# Patient Record
Sex: Female | Born: 1990 | Race: White | Hispanic: No | Marital: Married | Smoking: Never smoker
Health system: Southern US, Community
[De-identification: ages and names within clinical notes are randomized; demographics above are authoritative.]

## PROBLEM LIST (undated history)

## (undated) ENCOUNTER — Inpatient Hospital Stay: Payer: Self-pay

## (undated) DIAGNOSIS — Z789 Other specified health status: Secondary | ICD-10-CM

---
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
## 2012-12-20 ENCOUNTER — Telehealth: Payer: Self-pay | Admitting: Nurse Practitioner

## 2012-12-20 NOTE — Telephone Encounter (Signed)
Patient called regarding a question about a recent bill, and she also wanted to know if her new insurance will cover it.

## 2013-02-27 ENCOUNTER — Encounter: Payer: Self-pay | Admitting: Nurse Practitioner

## 2013-02-28 ENCOUNTER — Ambulatory Visit: Payer: Self-pay | Admitting: Nurse Practitioner

## 2013-02-28 ENCOUNTER — Encounter: Payer: Self-pay | Admitting: Nurse Practitioner

## 2014-07-29 ENCOUNTER — Other Ambulatory Visit: Payer: Self-pay | Admitting: Family Medicine

## 2014-07-29 DIAGNOSIS — E041 Nontoxic single thyroid nodule: Secondary | ICD-10-CM

## 2014-07-31 ENCOUNTER — Ambulatory Visit
Admission: RE | Admit: 2014-07-31 | Discharge: 2014-07-31 | Disposition: A | Discharge status: Home / Self Care | Payer: 59 | Source: Ambulatory Visit | Attending: Family Medicine | Admitting: Family Medicine

## 2014-07-31 DIAGNOSIS — E041 Nontoxic single thyroid nodule: Secondary | ICD-10-CM

## 2014-08-05 ENCOUNTER — Other Ambulatory Visit: Payer: Self-pay | Admitting: Family Medicine

## 2014-08-05 DIAGNOSIS — E041 Nontoxic single thyroid nodule: Secondary | ICD-10-CM

## 2014-08-13 ENCOUNTER — Ambulatory Visit
Admission: RE | Admit: 2014-08-13 | Discharge: 2014-08-13 | Disposition: A | Discharge status: Home / Self Care | Payer: 59 | Source: Ambulatory Visit | Attending: Family Medicine | Admitting: Family Medicine

## 2014-08-13 ENCOUNTER — Other Ambulatory Visit: Payer: Self-pay | Admitting: Family Medicine

## 2014-08-13 DIAGNOSIS — E041 Nontoxic single thyroid nodule: Secondary | ICD-10-CM

## 2014-09-03 LAB — OB RESULTS CONSOLE GC/CHLAMYDIA
CHLAMYDIA, DNA PROBE: NEGATIVE
GC PROBE AMP, GENITAL: NEGATIVE

## 2014-09-18 LAB — OB RESULTS CONSOLE HIV ANTIBODY (ROUTINE TESTING): HIV: NONREACTIVE

## 2014-09-18 LAB — OB RESULTS CONSOLE HEPATITIS B SURFACE ANTIGEN: HEP B S AG: NEGATIVE

## 2014-09-18 LAB — OB RESULTS CONSOLE VARICELLA ZOSTER ANTIBODY, IGG: Varicella: IMMUNE

## 2014-09-18 LAB — OB RESULTS CONSOLE RPR: RPR: NONREACTIVE

## 2014-09-18 LAB — OB RESULTS CONSOLE RUBELLA ANTIBODY, IGM: Rubella: IMMUNE

## 2014-09-18 LAB — OB RESULTS CONSOLE ABO/RH: RH TYPE: NEGATIVE

## 2015-01-22 LAB — OB RESULTS CONSOLE HIV ANTIBODY (ROUTINE TESTING): HIV: NONREACTIVE

## 2015-03-20 ENCOUNTER — Observation Stay
Admission: EM | Admit: 2015-03-20 | Discharge: 2015-03-20 | Disposition: A | Discharge status: Home / Self Care | Payer: 59 | Attending: Obstetrics & Gynecology | Admitting: Obstetrics & Gynecology

## 2015-03-20 ENCOUNTER — Encounter: Payer: Self-pay | Admitting: *Deleted

## 2015-03-20 DIAGNOSIS — Z3A35 35 weeks gestation of pregnancy: Secondary | ICD-10-CM | POA: Insufficient documentation

## 2015-03-20 DIAGNOSIS — N898 Other specified noninflammatory disorders of vagina: Secondary | ICD-10-CM | POA: Insufficient documentation

## 2015-03-20 DIAGNOSIS — O26893 Other specified pregnancy related conditions, third trimester: Secondary | ICD-10-CM | POA: Diagnosis present

## 2015-03-20 HISTORY — DX: Other specified health status: Z78.9

## 2015-03-20 LAB — URINALYSIS COMPLETE WITH MICROSCOPIC (ARMC ONLY)
BILIRUBIN URINE: NEGATIVE
GLUCOSE, UA: NEGATIVE mg/dL
HGB URINE DIPSTICK: NEGATIVE
Ketones, ur: NEGATIVE mg/dL
Nitrite: NEGATIVE
Protein, ur: NEGATIVE mg/dL
SPECIFIC GRAVITY, URINE: 1.016 (ref 1.005–1.030)
pH: 6 (ref 5.0–8.0)

## 2015-03-20 LAB — CHLAMYDIA/NGC RT PCR (ARMC ONLY)
CHLAMYDIA TR: NOT DETECTED
N gonorrhoeae: NOT DETECTED

## 2015-03-20 LAB — OB RESULTS CONSOLE GBS: STREP GROUP B AG: NEGATIVE

## 2015-03-20 MED ORDER — BETAMETHASONE SOD PHOS & ACET 6 (3-3) MG/ML IJ SUSP
12.0000 mg | INTRAMUSCULAR | Status: DC
  Administered 2015-03-20: 12 mg via INTRAMUSCULAR
  Filled 2015-03-20: qty 2

## 2015-03-20 MED ORDER — TERBUTALINE SULFATE 1 MG/ML IJ SOLN
0.2500 mg | Freq: Once | INTRAMUSCULAR | Status: AC
  Administered 2015-03-20: 0.25 mg via SUBCUTANEOUS

## 2015-03-20 MED ORDER — ACETAMINOPHEN 325 MG PO TABS: 650.0000 mg | ORAL_TABLET | ORAL | Status: DC | PRN

## 2015-03-20 MED ORDER — TERBUTALINE SULFATE 1 MG/ML IJ SOLN
INTRAMUSCULAR | Status: AC
  Administered 2015-03-20: 0.25 mg via SUBCUTANEOUS
  Filled 2015-03-20: qty 1

## 2015-03-20 NOTE — OB Triage Note (Signed)
Seen by Dr. Elesa MassedWard. Cervix reevaluated, no change. Discharge home. Follow-up next week with scheduled appointment. Elaina HoopsElks, Janelly Switalski S

## 2015-03-20 NOTE — Discharge Summary (Signed)
Hailey Jones is a 24 y.o. female. G1P0 @ 35w6 by 9w US with EDC of 04/18/15  Indication: thinks she broke her water  S: Resting comfortably. Not feeling any CTX, no VB. Active fetal movement.  O:  BP 117/58 mmHg  Pulse 78  Temp(Src) 98.1 F (36.7 C) (Oral)  Resp 20  Ht 5\' 3"  (1.6 m)  Wt 79.379 kg (175 lb)  BMI 31.01 kg/m2 Results for orders placed or performed during the hospital encounter of 03/20/15 (from the past 48 hour(s))  Urinalysis complete, with microscopic [facility] only)   Collection Time: 03/20/15  2:39 PM  Result Value Ref Range   Color, Urine YELLOW (A) YELLOW   APPearance CLEAR (A) CLEAR   Glucose, UA NEGATIVE NEGATIVE mg/dL   Bilirubin Urine NEGATIVE NEGATIVE   Ketones, ur NEGATIVE NEGATIVE mg/dL   Specific Gravity, Urine 1.016 1.005 - 1.030   Hgb urine dipstick NEGATIVE NEGATIVE   pH 6.0 5.0 - 8.0   Protein, ur NEGATIVE NEGATIVE mg/dL   Nitrite NEGATIVE NEGATIVE   Leukocytes, UA TRACE (A) NEGATIVE   RBC / HPF 0-5 0 - 5 RBC/hpf   WBC, UA 0-5 0 - 5 WBC/hpf   Bacteria, UA RARE (A) NONE SEEN   Squamous Epithelial / LPF 0-5 (A) NONE SEEN   Mucous PRESENT    Amorphous Crystal PRESENT      Gen: NAD, AAOx3      Abd: FNTTP      Ext: Non-tender, Nonedmeatous Pelvis: neg nitrazine, neg ferning, no pooling    FHT: 130 mod +accels no decels TOCO: q2-603min SVE: FT/thick/high, soft, middle --> see note below  I was scrubbed in the OR when patient presented.  Initial nursing exam was 5cm with bulging bag, with contractions q2-3 mins.  I verbally ordered terbutaline and betamethasone.  This was administered prior to my ability to evaluate the patient.  Upon evaluation, the patient was fingertip, and thick.  She has a smooth firm nodule on her anterior left cervix (1:00), consistent with, but not diagnostic of a nabothian cyst.  Cephalic by Leopolds.     A/P:  23yo G1P0 @ 35.6 with r/o ROM and r/o PTL   Labor: not present.   R/o ROM: SSE negative x 3. Neg nitrazine, neg  ferning.   Fetal Wellbeing: Reassuring Cat 1 tracing.  GBS, GC/CT collected and pending, urine negative (above).  Discussed with patient and husband my reasoning for the tocolytic and steroids.  Because 1/2 course BMZ was given, I have asked the patient to return tomorrow for the second shot for completion.  She states she will.  Patient never felt the contractions visible on monitoring.  D/c home stable, precautions reviewed, follow-up as scheduled. Preterm labor precautions reviewed.     ----- Ranae Plumberhelsea Terrianna Holsclaw, MD Attending Obstetrician and Gynecologist [facility]

## 2015-03-20 NOTE — OB Triage Note (Signed)
Possible SROM today @ approx. 1130. Reports clear fluid. No gush of fluid, more of a trickle. Elaina HoopsElks, Orianna Biskup S

## 2015-03-21 ENCOUNTER — Inpatient Hospital Stay
Admission: EM | Admit: 2015-03-21 | Discharge: 2015-03-21 | Disposition: A | Discharge status: Home / Self Care | Payer: 59 | Attending: Obstetrics & Gynecology | Admitting: Obstetrics & Gynecology

## 2015-03-21 DIAGNOSIS — Z3493 Encounter for supervision of normal pregnancy, unspecified, third trimester: Secondary | ICD-10-CM | POA: Diagnosis not present

## 2015-03-21 DIAGNOSIS — Z3A36 36 weeks gestation of pregnancy: Secondary | ICD-10-CM | POA: Insufficient documentation

## 2015-03-21 MED ORDER — BETAMETHASONE SOD PHOS & ACET 6 (3-3) MG/ML IJ SUSP
INTRAMUSCULAR | Status: AC
  Administered 2015-03-21: 12 mg via INTRAMUSCULAR
  Filled 2015-03-21: qty 1

## 2015-03-21 MED ORDER — BETAMETHASONE SOD PHOS & ACET 6 (3-3) MG/ML IJ SUSP
12.0000 mg | Freq: Once | INTRAMUSCULAR | Status: AC
  Administered 2015-03-21: 12 mg via INTRAMUSCULAR

## 2015-03-23 LAB — CULTURE, BETA STREP (GROUP B ONLY)

## 2015-04-19 ENCOUNTER — Observation Stay
Admission: EM | Admit: 2015-04-19 | Discharge: 2015-04-19 | Disposition: A | Discharge status: Home / Self Care | Payer: 59 | Source: Home / Self Care | Admitting: Obstetrics and Gynecology

## 2015-04-19 ENCOUNTER — Encounter: Payer: Self-pay | Admitting: *Deleted

## 2015-04-19 DIAGNOSIS — O163 Unspecified maternal hypertension, third trimester: Secondary | ICD-10-CM | POA: Diagnosis present

## 2015-04-19 LAB — COMPREHENSIVE METABOLIC PANEL
ALK PHOS: 144 U/L — AB (ref 38–126)
ALT: 10 U/L — AB (ref 14–54)
AST: 20 U/L (ref 15–41)
Albumin: 2.7 g/dL — ABNORMAL LOW (ref 3.5–5.0)
Anion gap: 4 — ABNORMAL LOW (ref 5–15)
BUN: 13 mg/dL (ref 6–20)
CHLORIDE: 109 mmol/L (ref 101–111)
CO2: 22 mmol/L (ref 22–32)
Calcium: 9 mg/dL (ref 8.9–10.3)
Creatinine, Ser: 0.7 mg/dL (ref 0.44–1.00)
Glucose, Bld: 93 mg/dL (ref 65–99)
Potassium: 3.7 mmol/L (ref 3.5–5.1)
Sodium: 135 mmol/L (ref 135–145)
Total Bilirubin: 0.4 mg/dL (ref 0.3–1.2)
Total Protein: 6.1 g/dL — ABNORMAL LOW (ref 6.5–8.1)

## 2015-04-19 LAB — TYPE AND SCREEN
ABO/RH(D): O NEG
Antibody Screen: NEGATIVE

## 2015-04-19 LAB — CBC
HEMATOCRIT: 30.1 % — AB (ref 35.0–47.0)
HEMOGLOBIN: 9.6 g/dL — AB (ref 12.0–16.0)
MCH: 25.8 pg — AB (ref 26.0–34.0)
MCHC: 31.8 g/dL — ABNORMAL LOW (ref 32.0–36.0)
MCV: 80.9 fL (ref 80.0–100.0)
Platelets: 226 10*3/uL (ref 150–440)
RBC: 3.73 MIL/uL — AB (ref 3.80–5.20)
RDW: 16.6 % — ABNORMAL HIGH (ref 11.5–14.5)
WBC: 8.7 10*3/uL (ref 3.6–11.0)

## 2015-04-19 LAB — PROTEIN / CREATININE RATIO, URINE
Creatinine, Urine: 210 mg/dL
Protein Creatinine Ratio: 0.26 mg/mg{Cre} — ABNORMAL HIGH (ref 0.00–0.15)
Total Protein, Urine: 54 mg/dL

## 2015-04-19 NOTE — OB Triage Note (Signed)
Sent from office for ellevated BPs 130/90 143/97 if office, reactive NST and AFI 9.3 cervix 0/40/-3.

## 2015-04-19 NOTE — H&P (Addendum)
Obstetrics Admission History & Physical  04/19/2015 - 5:31 PM Primary OBGYN: [facility]  Chief Complaint: elevated BPs in clinic  History of Present Illness  24 y.o. G1 @ [redacted]w[redacted]d (Dating: EDC 11/13, dated by 9wk u/s), with the above CC. Pregnancy complicated by: BMI 33, ?hypothyroidism, Rh negative, half-sister with dandy walker.  Ms. Quinita Kostelecky states that she has no decreased FM, labor s/s or s/s of pre-eclampsia (cp, sob, HA, visual s/s, belly pains). Patient was seen in clinic and 130/90, 143/97, protein trace with normal AFI and cephalic. Pt states BP was taken right after cx check and she states that she was "unfavorable."  Review of Systems:  her 12 point review of systems is negative or as noted in the History of Present Illness.  PMHx:  Past Medical History  Diagnosis Date  . Medical history non-contributory    PSHx:  Past Surgical History  Procedure Laterality Date  . Wisdom tooth extraction  2011   Medications:  Prescriptions prior to admission  Medication Sig Dispense Refill Last Dose  . Prenatal Vit-Fe Fumarate-FA (MULTIVITAMIN-PRENATAL) 27-0.8 MG TABS tablet Take 1 tablet by mouth daily at 12 noon.   03/19/2015 at Unknown time    Allergies: has No Known Allergies. OBHx:  OB History  Gravida Para Term Preterm AB SAB TAB Ectopic Multiple Living     # Outcome Date GA Lbr Len/2nd Weight Sex Delivery Anes PTL Lv  1 Current              GYNHx:  History of abnormal pap smears: No. History of STIs: No..             FHx:  Family History  Problem Relation Age of Onset  . Hypertension Father   . Hypertension Maternal Grandmother   . Heart disease Maternal Grandfather   . Hypertension Paternal Grandmother   . Heart disease Paternal Grandmother   . Heart disease Paternal Grandfather    Soc Hx:  Social History   Social History  . Marital Status: Married    Spouse Name: N/A  . Number of Children: 0  . Years of Education: N/A   Occupational  History  . Not on file.   Social History Main Topics  . Smoking status: Never Smoker   . Smokeless tobacco: Never Used  . Alcohol Use: No  . Drug Use: No  . Sexual Activity: Yes   Other Topics Concern  . Not on file   Social History Narrative    Objective    Current Vital Signs 24h Vital Sign Ranges  T   No Data Recorded  BP 131/80 mmHg BP  Min: 123/59  Max: 131/80  HR 75 Pulse  Avg: 76  Min: 75  Max: 77  RR  18 No Data Recorded  SaO2    98/RA No Data Recorded       24 Hour I/O Current Shift I/O  Time Ins Outs       Patient Vitals for the past 6 hrs:  BP Pulse  04/19/15 1700 131/80 mmHg 75  04/19/15 1613 (!) 123/59 mmHg 77   EFM: 125 baseline, +accels, no decels, mod var  Toco: irregular  General: Well nourished, well developed female in no acute distress.  Skin:  Warm and dry.  Cardiovascular: Regular rate and rhythm. Respiratory:  Clear to auscultation bilateral. Normal respiratory effort Abdomen: gravid, nttp Neuro/Psych:  Normal mood and affect. 1+ brachial  Labs  none  Radiology none  Perinatal info  O neg/ Rubella  Immune / Varicella Immune/RPR neg/HIV Negative/HepB Surf Ag Negative/TDaP: yes / Flu shot: needed/pap neg 2016/  Assessment & Plan   24 y.o. G1P0000 @ 3636w1d with possible gHTN vs pre-eclampsia at term; pt currently doing well *IUP: rNST. Okay to come off monitor *HTN work up: continue with serial BPs. Follow up lab work. If negative and BPs still normal, recommend 2-3d BP check and can set her up for PDIOL today. Precautions given *?hypothyroidism: pt was dx just prior to pregnancy dx and self-d/c'ed due to ?causing some heart palps, as suggested by cardiologist, and patient wasn't taking and had normal 28wk TFTs. No heart s/s since self-d/c *GBS: neg *Rh negative: PP PRN *Dispo: pending labs and serial BPs  Lockhart Bingharlie Wilberta Dorvil, Montez HagemanJr. MD [facility] Pager [phone]    Recent Labs Lab 04/19/15 1634  WBC 8.7  HGB 9.6*  HCT 30.1*   PLT 226    Recent Labs Lab 04/19/15 1634  NA 135  K 3.7  CL 109  CO2 22  BUN 13  CREATININE 0.70  CALCIUM 9.0  PROT 6.1*  BILITOT 0.4  ALKPHOS 144*  ALT 10*  AST 20  GLUCOSE 93   PC ratio 260 Repeat BP 131/81 after one prior was SBP 140 (pt laying on side with cuff below heart level) Pt still astymptomatic  Will see her back on Thursday for BP check Set up for PDIOL on 11/22 @ 2000. Unable to do sooner b/c of dates are full Will see her back in clinic for 41/0 or 41/1 NST, AFI b/c post dates Precautions given Time 1750 Cornelia Copaharlie Braven Wolk, Jr MD [facility]  Pager: [phone]

## 2015-04-19 NOTE — Discharge Instructions (Signed)
Preeclampsia and Eclampsia °Preeclampsia is a serious condition that develops only during pregnancy. It is also called toxemia of pregnancy. This condition causes high blood pressure along with other symptoms, such as swelling and headaches. These may develop as the condition gets worse. Preeclampsia may occur 20 weeks or later into your pregnancy.  °Diagnosing and treating preeclampsia early is very important. If not treated early, it can cause serious problems for you and your baby. One problem it can lead to is eclampsia, which is a condition that causes muscle jerking or shaking (convulsions) in the mother. Delivering your baby is the best treatment for preeclampsia or eclampsia.  °RISK FACTORS °The cause of preeclampsia is not known. You may be more likely to develop preeclampsia if you have certain risk factors. These include:  °· Being pregnant for the first time. °· Having preeclampsia in a past pregnancy. °· Having a family history of preeclampsia. °· Having high blood pressure. °· Being pregnant with twins or triplets. °· Being 35 or older. °· Being African American. °· Having kidney disease or diabetes. °· Having medical conditions such as lupus or blood diseases. °· Being very overweight (obese). °SIGNS AND SYMPTOMS  °The earliest signs of preeclampsia are: °· High blood pressure. °· Increased protein in your urine. Your health care provider will check for this at every prenatal visit. °Other symptoms that can develop include:  °· Severe headaches. °· Sudden weight gain. °· Swelling of your hands, face, legs, and feet. °· Feeling sick to your stomach (nauseous) and throwing up (vomiting). °· Vision problems (blurred or double vision). °· Numbness in your face, arms, legs, and feet. °· Dizziness. °· Slurred speech. °· Sensitivity to bright lights. °· Abdominal pain. °DIAGNOSIS  °There are no screening tests for preeclampsia. Your health care provider will ask you about symptoms and check for signs of  preeclampsia during your prenatal visits. You may also have tests, including: °· Urine testing. °· Blood testing. °· Checking your baby's heart rate. °· Checking the health of your baby and your placenta using images created with sound waves (ultrasound). °TREATMENT  °You can work out the best treatment approach together with your health care provider. It is very important to keep all prenatal appointments. If you have an increased risk of preeclampsia, you may need more frequent prenatal exams. °· Your health care provider may prescribe bed rest. °· You may have to eat as little salt as possible. °· You may need to take medicine to lower your blood pressure if the condition does not respond to more conservative measures. °· You may need to stay in the hospital if your condition is severe. There, treatment will focus on controlling your blood pressure and fluid retention. You may also need to take medicine to prevent seizures. °· If the condition gets worse, your baby may need to be delivered early to protect you and the baby. You may have your labor started with medicine (be induced), or you may have a cesarean delivery. °· Preeclampsia usually goes away after the baby is born. °HOME CARE INSTRUCTIONS  °· Only take over-the-counter or prescription medicines as directed by your health care provider. °· Lie on your left side while resting. This keeps pressure off your baby. °· Elevate your feet while resting. °· Get regular exercise. Ask your health care provider what type of exercise is safe for you. °· Avoid caffeine and alcohol. °· Do not smoke. °· Drink 6-8 glasses of water every day. °· Eat a balanced diet   that is low in salt. Do not add salt to your food.  Avoid stressful situations as much as possible.  Get plenty of rest and sleep.  Keep all prenatal appointments and tests as scheduled. SEEK MEDICAL CARE IF:  You are gaining more weight than expected.  You have any headaches, abdominal pain, or  nausea.  You are bruising more than usual.  You feel dizzy or light-headed. SEEK IMMEDIATE MEDICAL CARE IF:   You develop sudden or severe swelling anywhere in your body. This usually happens in the legs.  You gain 5 lb (2.3 kg) or more in a week.  You have a severe headache, dizziness, problems with your vision, or confusion.  You have severe abdominal pain.  You have lasting nausea or vomiting.  You have a seizure.  You have trouble moving any part of your body.  You develop numbness in your body.  You have trouble speaking.  You have any abnormal bleeding.  You develop a stiff neck.  You pass out. MAKE SURE YOU:   Understand these instructions.  Will watch your condition.  Will get help right away if you are not doing well or get worse.   This information is not intended to replace advice given to you by your health care provider. Make sure you discuss any questions you have with your health care provider.   Document Released: 05/19/2000 Document Revised: 05/27/2013 Document Reviewed: 03/14/2013 Elsevier Interactive Patient Education [employer]2016 Elsevier Inc.     We will discuss your surgery once again in detail at your post-op visit in two to four weeks. If you havent already done so, please call to make your appointment as soon as possible.  [facility]  [address]  [place], [place] [address] [place], [place] [address]  Phone: [phone] Phone: [phone]  Fax: [phone] Fax: [phone]

## 2015-04-19 NOTE — Discharge Summary (Signed)
Patient discharged home, discharge instructions given, patient states understanding. Patient left floor in stable condition, denies any other needs at this time. Patient to schedule appointment for wed or thurs

## 2015-04-19 NOTE — Discharge Summary (Signed)
OB Triage Discharge Summary   24 y.o. G1 @ 4117w1d presenting for pre-eclampsia work up.  Fetal status reassuring. EFM showed rNST with irregular UCs. Triage course: negative serial BPs, exam and lab work up for gHTN/pre-eclampsia  Labs pending: none RTC 2 days for BP check Disposition: Home. Pt set up for 11/22 @ 2000 PDIOL.    Cornelia Copaharlie Scout Guyett, Jr MD [facility]  Pager: [phone]

## 2015-04-20 LAB — ABO/RH: ABO/RH(D): O NEG

## 2015-04-21 ENCOUNTER — Inpatient Hospital Stay
Admission: EM | Admit: 2015-04-21 | Discharge: 2015-04-25 | DRG: 765 | Disposition: A | Discharge status: Home / Self Care | Payer: 59 | Attending: Obstetrics and Gynecology | Admitting: Obstetrics and Gynecology

## 2015-04-21 DIAGNOSIS — O480 Post-term pregnancy: Secondary | ICD-10-CM | POA: Diagnosis present

## 2015-04-21 DIAGNOSIS — E039 Hypothyroidism, unspecified: Secondary | ICD-10-CM | POA: Diagnosis present

## 2015-04-21 DIAGNOSIS — D62 Acute posthemorrhagic anemia: Secondary | ICD-10-CM | POA: Diagnosis not present

## 2015-04-21 DIAGNOSIS — O99284 Endocrine, nutritional and metabolic diseases complicating childbirth: Secondary | ICD-10-CM | POA: Diagnosis present

## 2015-04-21 DIAGNOSIS — Z3A40 40 weeks gestation of pregnancy: Secondary | ICD-10-CM | POA: Diagnosis not present

## 2015-04-21 DIAGNOSIS — O1494 Unspecified pre-eclampsia, complicating childbirth: Secondary | ICD-10-CM | POA: Diagnosis present

## 2015-04-21 DIAGNOSIS — O9081 Anemia of the puerperium: Secondary | ICD-10-CM | POA: Diagnosis not present

## 2015-04-21 DIAGNOSIS — O133 Gestational [pregnancy-induced] hypertension without significant proteinuria, third trimester: Secondary | ICD-10-CM | POA: Diagnosis present

## 2015-04-21 LAB — COMPREHENSIVE METABOLIC PANEL
ALBUMIN: 2.7 g/dL — AB (ref 3.5–5.0)
ALT: 10 U/L — ABNORMAL LOW (ref 14–54)
ANION GAP: 9 (ref 5–15)
AST: 17 U/L (ref 15–41)
Alkaline Phosphatase: 159 U/L — ABNORMAL HIGH (ref 38–126)
BUN: 13 mg/dL (ref 6–20)
CHLORIDE: 108 mmol/L (ref 101–111)
CO2: 21 mmol/L — ABNORMAL LOW (ref 22–32)
Calcium: 9.1 mg/dL (ref 8.9–10.3)
Creatinine, Ser: 0.58 mg/dL (ref 0.44–1.00)
GFR calc Af Amer: >60 mL/min (ref >60)
GFR calc non Af Amer: >60 mL/min (ref >60)
GLUCOSE: 87 mg/dL (ref 65–99)
POTASSIUM: 3.7 mmol/L (ref 3.5–5.1)
SODIUM: 138 mmol/L (ref 135–145)
TOTAL PROTEIN: 6.3 g/dL — AB (ref 6.5–8.1)
Total Bilirubin: 0.5 mg/dL (ref 0.3–1.2)

## 2015-04-21 LAB — TYPE AND SCREEN
ABO/RH(D): O NEG
Antibody Screen: NEGATIVE

## 2015-04-21 LAB — CHLAMYDIA/NGC RT PCR (ARMC ONLY)
Chlamydia Tr: NOT DETECTED
N GONORRHOEAE: NOT DETECTED

## 2015-04-21 LAB — PROTEIN / CREATININE RATIO, URINE
CREATININE, URINE: 229 mg/dL
PROTEIN CREATININE RATIO: 0.39 mg/mg{creat} — AB (ref 0.00–0.15)
TOTAL PROTEIN, URINE: 90 mg/dL

## 2015-04-21 LAB — CBC
HCT: 30.2 % — ABNORMAL LOW (ref 35.0–47.0)
HEMOGLOBIN: 9.8 g/dL — AB (ref 12.0–16.0)
MCH: 26.3 pg (ref 26.0–34.0)
MCHC: 32.6 g/dL (ref 32.0–36.0)
MCV: 80.7 fL (ref 80.0–100.0)
Platelets: 211 10*3/uL (ref 150–440)
RBC: 3.74 MIL/uL — AB (ref 3.80–5.20)
RDW: 17 % — ABNORMAL HIGH (ref 11.5–14.5)
WBC: 9 10*3/uL (ref 3.6–11.0)

## 2015-04-21 LAB — TSH: TSH: 2.828 u[IU]/mL (ref 0.350–4.500)

## 2015-04-21 LAB — T4, FREE: Free T4: 0.7 ng/dL (ref 0.61–1.12)

## 2015-04-21 MED ORDER — CITRIC ACID-SODIUM CITRATE 334-500 MG/5ML PO SOLN
ORAL | Status: AC
  Administered 2015-04-22: 30 mL
  Filled 2015-04-21: qty 15

## 2015-04-21 MED ORDER — ONDANSETRON HCL 4 MG/2ML IJ SOLN
4.0000 mg | Freq: Four times a day (QID) | INTRAMUSCULAR | Status: DC | PRN
  Administered 2015-04-22 (×2): 4 mg via INTRAVENOUS
  Filled 2015-04-21 (×2): qty 2

## 2015-04-21 MED ORDER — OXYTOCIN 40 UNITS IN LACTATED RINGERS INFUSION - SIMPLE MED
62.5000 mL/h | INTRAVENOUS | Status: DC
  Administered 2015-04-22: 400 mL via INTRAVENOUS
  Filled 2015-04-21 (×2): qty 1000

## 2015-04-21 MED ORDER — MISOPROSTOL 200 MCG PO TABS: 800.0000 ug | ORAL_TABLET | Freq: Once | ORAL | Status: DC | PRN

## 2015-04-21 MED ORDER — BUTORPHANOL TARTRATE 1 MG/ML IJ SOLN
1.0000 mg | Freq: Two times a day (BID) | INTRAMUSCULAR | Status: DC | PRN
  Administered 2015-04-22: 1 mg via INTRAVENOUS
  Filled 2015-04-21 (×2): qty 1

## 2015-04-21 MED ORDER — LACTATED RINGERS IV SOLN
500.0000 mL | INTRAVENOUS | Status: DC | PRN
  Administered 2015-04-21: 250 mL via INTRAVENOUS
  Administered 2015-04-22 (×3): 500 mL via INTRAVENOUS

## 2015-04-21 MED ORDER — OXYTOCIN BOLUS FROM INFUSION: 500.0000 mL | INTRAVENOUS | Status: DC

## 2015-04-21 MED ORDER — TERBUTALINE SULFATE 1 MG/ML IJ SOLN: 0.2500 mg | Freq: Once | INTRAMUSCULAR | Status: DC | PRN

## 2015-04-21 MED ORDER — LIDOCAINE HCL (PF) 1 % IJ SOLN: 30.0000 mL | INTRAMUSCULAR | Status: DC | PRN

## 2015-04-21 MED ORDER — AMMONIA AROMATIC IN INHA: 0.3000 mL | Freq: Once | RESPIRATORY_TRACT | Status: DC | PRN

## 2015-04-21 MED ORDER — DINOPROSTONE 10 MG VA INST
10.0000 mg | VAGINAL_INSERT | Freq: Once | VAGINAL | Status: AC
  Administered 2015-04-21: 10 mg via VAGINAL
  Filled 2015-04-21: qty 1

## 2015-04-21 MED ORDER — LACTATED RINGERS IV SOLN
INTRAVENOUS | Status: DC
  Administered 2015-04-21 – 2015-04-22 (×5): via INTRAVENOUS

## 2015-04-21 NOTE — H&P (Signed)
Date of Initial H&P:04/21/2015  History reviewed, patient examined, and  typed H&P updated. Agree with H&P with changes  24 year old G1 P0 with EDC=04/18/2015 by a 9 week ultrasound admitted for IOL due to gestational hypertension. Blood pressures in the mild range and PIH labs 2 days ago WNL. [facility] for persistent nausea and vomiting of pregnancy (has gained 42#), hypothyroidism (was on Synthroid in early pregnancy, but was stopped in Sept. per recommendation of cardiologist who saw patient for palpitations), being RH negative (received Rhogam at 26 weeks), and mild anemia. She was evaluated for threatened preterm labor at 35.6 weeks and was given BMZ x2.  Labs: O neg/ RI/VI/GBS negative. TDAP UTD ROS: denies headaches, scotomata, CP, SOB, RUQ pain. Having some irregular ctxs and some blood tinged discharge since exam earlier today. Exam: General: in NAD BP 138/79 mmHg  Pulse 84  Temp(Src) 98.3 F (36.8 C) (Oral)  Resp 18  Ht 5\' 3"  (1.6 m)  Wt 183 lb (83.008 kg)  BMI 32.43 kg/m2  FHR: 135 with accelerations to 160s to 170s, moderate variability Toco: contractions q 5-8 min apart, mild Cervix: 2/50%/-3/vtx/posterior/medium in office  A: IUP at 40.3 weeks with gestational hypertension for IOL  P: Admit and get labs Discussed induction process and risks of hyperstimulation, FTP, failed IOL, FITL, serial induction,  and Cesarean section with use of Cervidil, Cytotec or Pitocin. Will probably use cervidil if cx is unchanged Desires IV analgesia if needed GBS negative-no PPX needed. FWB: Cat 1 strip. Will monitor blood pressures closely  Ceazia Harb, CNM

## 2015-04-21 NOTE — Progress Notes (Signed)
L&D Progress Note  S: Not really feeling contractions  O: General :in NAD BP range 130s/70s to 80s FHR tracing has been wonderfully reactive with baseline 125-130 with accelerations to 160s to 170 until 1730. At that time she sat up to eat something and there was a FHR decel to the 60s x 3 minutes. Deceleration resolved spontaneously with position change. Cervix at that time still 2/50-60%/-1 and posterior. +bloody show. Over the last 1.5 hours , FHR has been reactive.  Contractions q 4-8 min apart Results for orders placed or performed during the hospital encounter of 04/21/15 (from the past 24 hour(s))  TSH     Status: None   Collection Time: 04/21/15  4:37 PM  Result Value Ref Range   TSH 2.828 0.350 - 4.500 uIU/mL  T4, free     Status: None   Collection Time: 04/21/15  4:37 PM  Result Value Ref Range   Free T4 0.70 0.61 - 1.12 ng/dL  CBC     Status: Abnormal   Collection Time: 04/21/15  4:37 PM  Result Value Ref Range   WBC 9.0 3.6 - 11.0 K/uL   RBC 3.74 (L) 3.80 - 5.20 MIL/uL   Hemoglobin 9.8 (L) 12.0 - 16.0 g/dL   HCT 57.8 (L) 46.9 - 62.9 %   MCV 80.7 80.0 - 100.0 fL   MCH 26.3 26.0 - 34.0 pg   MCHC 32.6 32.0 - 36.0 g/dL   RDW 52.8 (H) 41.3 - 24.4 %   Platelets 211 150 - 440 K/uL  Comprehensive metabolic panel     Status: Abnormal   Collection Time: 04/21/15  4:37 PM  Result Value Ref Range   Sodium 138 135 - 145 mmol/L   Potassium 3.7 3.5 - 5.1 mmol/L   Chloride 108 101 - 111 mmol/L   CO2 21 (L) 22 - 32 mmol/L   Glucose, Bld 87 65 - 99 mg/dL   BUN 13 6 - 20 mg/dL   Creatinine, Ser 0.10 0.44 - 1.00 mg/dL   Calcium 9.1 8.9 - 27.2 mg/dL   Total Protein 6.3 (L) 6.5 - 8.1 g/dL   Albumin 2.7 (L) 3.5 - 5.0 g/dL   AST 17 15 - 41 U/L   ALT 10 (L) 14 - 54 U/L   Alkaline Phosphatase 159 (H) 38 - 126 U/L   Total Bilirubin 0.5 0.3 - 1.2 mg/dL   GFR calc non Af Amer >60 >60 mL/min   GFR calc Af Amer >60 >60 mL/min   Anion gap 9 5 - 15  Protein / creatinine ratio, urine      Status: Abnormal   Collection Time: 04/21/15  4:40 PM  Result Value Ref Range   Creatinine, Urine 229 mg/dL   Total Protein, Urine 90 mg/dL   Protein Creatinine Ratio 0.39 (H) 0.00 - 0.15 mg/mg[Cre]  Type and screen     Status: None   Collection Time: 04/21/15  4:43 PM  Result Value Ref Range   ABO/RH(D) O NEG    Antibody Screen NEG    Sample Expiration 04/24/2015   Chlamydia/NGC rt PCR ([facility] only)     Status: None   Collection Time: 04/21/15  5:34 PM  Result Value Ref Range   Specimen source GC/Chlam URINE, RANDOM    Chlamydia Tr NOT DETECTED NOT DETECTED   N gonorrhoeae NOT DETECTED NOT DETECTED    A; Preeclampsia without severe features (P/C=390 mgm) Isolated FHR deceleration in an otherwise beautifully reactive strip.  P: Cervidil inserted at this  time.  Will monitor baby closely  Sieara Bremer, CNM

## 2015-04-21 NOTE — Progress Notes (Signed)
L&D Progress Note  Had another FHR deceleration to the 60s x 2-3 min preceded by a run of 5 contractions just before 2000. Contractions had been q4-6 min apart prior to that Deceleration resolved with position change and FHR now 140 BPM Cervix: 2.5/70%/-1 and anterior Cervidil removed BP 138/82  A: FHR deceleration possibly due to hyperstimulation  P: Consulted Dr Vergie LivingPickens who recommended expectant management over the next 4 hours to see if patient would progress on her own. If no progress by 12:00 AM can try Pitocin or AROM or ?foley bulb. Monitor FWB closely Maccoy Haubner, CNM

## 2015-04-22 ENCOUNTER — Encounter
Admission: EM | Disposition: A | Discharge status: Home / Self Care | Payer: Self-pay | Source: Home / Self Care | Attending: Obstetrics and Gynecology

## 2015-04-22 ENCOUNTER — Inpatient Hospital Stay: Payer: 59 | Admitting: Anesthesiology

## 2015-04-22 ENCOUNTER — Inpatient Hospital Stay: Payer: 59 | Admitting: Registered Nurse

## 2015-04-22 ENCOUNTER — Encounter: Payer: Self-pay | Admitting: Anesthesiology

## 2015-04-22 LAB — RPR: RPR: NONREACTIVE

## 2015-04-22 SURGERY — Surgical Case
Anesthesia: Spinal | Site: Abdomen | Wound class: Clean Contaminated

## 2015-04-22 MED ORDER — IBUPROFEN 600 MG PO TABS
600.0000 mg | ORAL_TABLET | Freq: Four times a day (QID) | ORAL | Status: DC | PRN
  Administered 2015-04-23 – 2015-04-25 (×7): 600 mg via ORAL
  Filled 2015-04-22 (×7): qty 1

## 2015-04-22 MED ORDER — LANOLIN HYDROUS EX OINT: 1.0000 "application " | TOPICAL_OINTMENT | CUTANEOUS | Status: DC | PRN

## 2015-04-22 MED ORDER — CEFAZOLIN SODIUM-DEXTROSE 2-3 GM-% IV SOLR
INTRAVENOUS | Status: AC
  Administered 2015-04-22: 2 g via INTRAVENOUS
  Filled 2015-04-22: qty 50

## 2015-04-22 MED ORDER — NALBUPHINE HCL 10 MG/ML IJ SOLN: 5.0000 mg | Freq: Once | INTRAMUSCULAR | Status: DC | PRN

## 2015-04-22 MED ORDER — NALBUPHINE HCL 10 MG/ML IJ SOLN: 5.0000 mg | INTRAMUSCULAR | Status: DC | PRN

## 2015-04-22 MED ORDER — BUPIVACAINE HCL (PF) 0.25 % IJ SOLN
INTRAMUSCULAR | Status: DC | PRN
  Administered 2015-04-22 (×2): 4 mL via EPIDURAL

## 2015-04-22 MED ORDER — OXYTOCIN 10 UNIT/ML IJ SOLN
INTRAMUSCULAR | Status: AC
  Filled 2015-04-22: qty 2

## 2015-04-22 MED ORDER — BUPIVACAINE IN DEXTROSE 0.75-8.25 % IT SOLN
INTRATHECAL | Status: DC | PRN
  Administered 2015-04-22: 1.6 mL via INTRATHECAL

## 2015-04-22 MED ORDER — LIDOCAINE-EPINEPHRINE (PF) 1.5 %-1:200000 IJ SOLN
INTRAMUSCULAR | Status: DC | PRN
  Administered 2015-04-22: 3 mL via EPIDURAL

## 2015-04-22 MED ORDER — TETANUS-DIPHTH-ACELL PERTUSSIS 5-2.5-18.5 LF-MCG/0.5 IM SUSP: 0.5000 mL | Freq: Once | INTRAMUSCULAR | Status: DC

## 2015-04-22 MED ORDER — LIDOCAINE HCL (PF) 1 % IJ SOLN
INTRAMUSCULAR | Status: AC
  Filled 2015-04-22: qty 30

## 2015-04-22 MED ORDER — MENTHOL 3 MG MT LOZG
1.0000 | LOZENGE | OROMUCOSAL | Status: DC | PRN
  Filled 2015-04-22: qty 9

## 2015-04-22 MED ORDER — DIPHENHYDRAMINE HCL 25 MG PO CAPS
25.0000 mg | ORAL_CAPSULE | ORAL | Status: DC | PRN
Start: 2015-04-22 — End: 2015-04-25

## 2015-04-22 MED ORDER — OXYTOCIN 40 UNITS IN LACTATED RINGERS INFUSION - SIMPLE MED: 62.5000 mL/h | INTRAVENOUS | Status: AC

## 2015-04-22 MED ORDER — SENNOSIDES-DOCUSATE SODIUM 8.6-50 MG PO TABS: 2.0000 | ORAL_TABLET | Freq: Every evening | ORAL | Status: DC | PRN

## 2015-04-22 MED ORDER — EPHEDRINE 5 MG/ML INJ
INTRAVENOUS | Status: AC
  Filled 2015-04-22: qty 10

## 2015-04-22 MED ORDER — NALOXONE HCL 2 MG/2ML IJ SOSY: 1.0000 ug/kg/h | PREFILLED_SYRINGE | INTRAMUSCULAR | Status: DC | PRN

## 2015-04-22 MED ORDER — INFLUENZA VAC SPLIT QUAD 0.5 ML IM SUSY: 0.5000 mL | PREFILLED_SYRINGE | INTRAMUSCULAR | Status: DC

## 2015-04-22 MED ORDER — DIBUCAINE 1 % RE OINT: 1.0000 "application " | TOPICAL_OINTMENT | RECTAL | Status: DC | PRN

## 2015-04-22 MED ORDER — AMMONIA AROMATIC IN INHA
RESPIRATORY_TRACT | Status: AC
  Filled 2015-04-22: qty 10

## 2015-04-22 MED ORDER — SODIUM CHLORIDE 0.9 % IJ SOLN
INTRAMUSCULAR | Status: AC
  Filled 2015-04-22: qty 20

## 2015-04-22 MED ORDER — ONDANSETRON HCL 4 MG/2ML IJ SOLN: 4.0000 mg | Freq: Once | INTRAMUSCULAR | Status: DC | PRN

## 2015-04-22 MED ORDER — WITCH HAZEL-GLYCERIN EX PADS: 1.0000 "application " | MEDICATED_PAD | CUTANEOUS | Status: DC | PRN

## 2015-04-22 MED ORDER — SODIUM CHLORIDE 0.9 % IJ SOLN: 3.0000 mL | INTRAMUSCULAR | Status: DC | PRN

## 2015-04-22 MED ORDER — MORPHINE SULFATE (PF) 2 MG/ML IV SOLN: 1.0000 mg | INTRAVENOUS | Status: DC | PRN

## 2015-04-22 MED ORDER — FENTANYL 2.5 MCG/ML W/ROPIVACAINE 0.2% IN NS 100 ML EPIDURAL INFUSION (ARMC-ANES)
EPIDURAL | Status: AC
  Administered 2015-04-22: 10 mL/h via EPIDURAL
  Filled 2015-04-22: qty 100

## 2015-04-22 MED ORDER — KETOROLAC TROMETHAMINE 30 MG/ML IJ SOLN
30.0000 mg | Freq: Four times a day (QID) | INTRAMUSCULAR | Status: AC | PRN
Start: 2015-04-22 — End: 2015-04-23
  Administered 2015-04-22 – 2015-04-23 (×3): 30 mg via INTRAVENOUS
  Filled 2015-04-22 (×4): qty 1

## 2015-04-22 MED ORDER — ONDANSETRON HCL 4 MG/2ML IJ SOLN: 4.0000 mg | Freq: Three times a day (TID) | INTRAMUSCULAR | Status: DC | PRN

## 2015-04-22 MED ORDER — TERBUTALINE SULFATE 1 MG/ML IJ SOLN: 0.2500 mg | Freq: Once | INTRAMUSCULAR | Status: DC | PRN

## 2015-04-22 MED ORDER — DIPHENHYDRAMINE HCL 50 MG/ML IJ SOLN: 12.5000 mg | INTRAMUSCULAR | Status: DC | PRN

## 2015-04-22 MED ORDER — SIMETHICONE 80 MG PO CHEW
80.0000 mg | CHEWABLE_TABLET | Freq: Two times a day (BID) | ORAL | Status: DC
  Administered 2015-04-22 – 2015-04-25 (×5): 80 mg via ORAL
  Filled 2015-04-22 (×5): qty 1

## 2015-04-22 MED ORDER — LACTATED RINGERS IV SOLN
INTRAVENOUS | Status: DC
  Administered 2015-04-22 – 2015-04-23 (×3): via INTRAVENOUS

## 2015-04-22 MED ORDER — NALOXONE HCL 0.4 MG/ML IJ SOLN: 0.4000 mg | INTRAMUSCULAR | Status: DC | PRN

## 2015-04-22 MED ORDER — CEFAZOLIN SODIUM-DEXTROSE 2-3 GM-% IV SOLR
2.0000 g | INTRAVENOUS | Status: AC
  Administered 2015-04-22: 2 g via INTRAVENOUS

## 2015-04-22 MED ORDER — SCOPOLAMINE 1 MG/3DAYS TD PT72: 1.0000 | MEDICATED_PATCH | Freq: Once | TRANSDERMAL | Status: DC

## 2015-04-22 MED ORDER — BUTORPHANOL TARTRATE 1 MG/ML IJ SOLN
1.0000 mg | INTRAMUSCULAR | Status: DC | PRN
  Administered 2015-04-22 (×2): 1 mg via INTRAVENOUS
  Filled 2015-04-22: qty 1

## 2015-04-22 MED ORDER — MISOPROSTOL 200 MCG PO TABS
ORAL_TABLET | ORAL | Status: AC
  Filled 2015-04-22: qty 4

## 2015-04-22 MED ORDER — FENTANYL CITRATE (PF) 100 MCG/2ML IJ SOLN: 25.0000 ug | INTRAMUSCULAR | Status: DC | PRN

## 2015-04-22 MED ORDER — MORPHINE SULFATE (PF) 0.5 MG/ML IJ SOLN
INTRAMUSCULAR | Status: DC | PRN
  Administered 2015-04-22: .2 mg via EPIDURAL

## 2015-04-22 MED ORDER — MEPERIDINE HCL 25 MG/ML IJ SOLN: 6.2500 mg | INTRAMUSCULAR | Status: DC | PRN

## 2015-04-22 MED ORDER — ONDANSETRON HCL 4 MG/2ML IJ SOLN
INTRAMUSCULAR | Status: DC | PRN
  Administered 2015-04-22: 4 mg via INTRAVENOUS

## 2015-04-22 MED ORDER — DIPHENHYDRAMINE HCL 25 MG PO CAPS: 25.0000 mg | ORAL_CAPSULE | Freq: Four times a day (QID) | ORAL | Status: DC | PRN

## 2015-04-22 MED ORDER — ACETAMINOPHEN 325 MG PO TABS: 650.0000 mg | ORAL_TABLET | ORAL | Status: DC | PRN

## 2015-04-22 MED ORDER — PROMETHAZINE HCL 25 MG/ML IJ SOLN
6.2500 mg | Freq: Two times a day (BID) | INTRAMUSCULAR | Status: DC | PRN
  Administered 2015-04-22: 6.25 mg via INTRAVENOUS

## 2015-04-22 MED ORDER — PRENATAL MULTIVITAMIN CH
1.0000 | ORAL_TABLET | Freq: Every day | ORAL | Status: DC
  Administered 2015-04-23 – 2015-04-25 (×3): 1 via ORAL
  Filled 2015-04-22 (×3): qty 1

## 2015-04-22 MED ORDER — PROMETHAZINE HCL 25 MG/ML IJ SOLN
INTRAMUSCULAR | Status: AC
  Filled 2015-04-22: qty 1

## 2015-04-22 MED ORDER — LIDOCAINE HCL (PF) 1 % IJ SOLN
INTRAMUSCULAR | Status: DC | PRN
  Administered 2015-04-22: 3 mL via SUBCUTANEOUS

## 2015-04-22 MED ORDER — KETOROLAC TROMETHAMINE 30 MG/ML IJ SOLN
30.0000 mg | Freq: Four times a day (QID) | INTRAMUSCULAR | Status: AC | PRN
  Filled 2015-04-22: qty 1

## 2015-04-22 MED ORDER — OXYCODONE HCL 5 MG PO TABS
5.0000 mg | ORAL_TABLET | Freq: Four times a day (QID) | ORAL | Status: DC | PRN
  Administered 2015-04-24 (×2): 5 mg via ORAL
  Filled 2015-04-22 (×2): qty 1

## 2015-04-22 MED ORDER — OXYTOCIN 40 UNITS IN LACTATED RINGERS INFUSION - SIMPLE MED
1.0000 m[IU]/min | INTRAVENOUS | Status: DC
  Administered 2015-04-22: 1 m[IU]/min via INTRAVENOUS

## 2015-04-22 SURGICAL SUPPLY — 29 items
BENZOIN TINCTURE PRP APPL 2/3 (GAUZE/BANDAGES/DRESSINGS) ×3 IMPLANT
CANISTER SUCT 3000ML (MISCELLANEOUS) ×3 IMPLANT
CHLORAPREP W/TINT 26ML (MISCELLANEOUS) ×3 IMPLANT
CLOSURE WOUND 1/2 X4 (GAUZE/BANDAGES/DRESSINGS) ×1
DRSG TELFA 3X8 NADH (GAUZE/BANDAGES/DRESSINGS) ×3 IMPLANT
ELECT CAUTERY BLADE 6.4 (BLADE) ×3 IMPLANT
GAUZE SPONGE 4X4 12PLY STRL (GAUZE/BANDAGES/DRESSINGS) ×3 IMPLANT
GLOVE BIO SURGEON STRL SZ7 (GLOVE) ×3 IMPLANT
GLOVE INDICATOR 7.5 STRL GRN (GLOVE) ×3 IMPLANT
GOWN STRL REUS W/ TWL LRG LVL3 (GOWN DISPOSABLE) ×1 IMPLANT
GOWN STRL REUS W/ TWL XL LVL3 (GOWN DISPOSABLE) ×2 IMPLANT
GOWN STRL REUS W/TWL LRG LVL3 (GOWN DISPOSABLE) ×2
GOWN STRL REUS W/TWL XL LVL3 (GOWN DISPOSABLE) ×4
LIQUID BAND (GAUZE/BANDAGES/DRESSINGS) ×3 IMPLANT
NS IRRIG 1000ML POUR BTL (IV SOLUTION) ×3 IMPLANT
PACK C SECTION AR (MISCELLANEOUS) ×3 IMPLANT
PAD GROUND ADULT SPLIT (MISCELLANEOUS) ×3 IMPLANT
PAD OB MATERNITY 4.3X12.25 (PERSONAL CARE ITEMS) ×3 IMPLANT
PAD PREP 24X41 OB/GYN DISP (PERSONAL CARE ITEMS) ×3 IMPLANT
SPONGE LAP 18X18 5 PK (GAUZE/BANDAGES/DRESSINGS) ×3 IMPLANT
STRIP CLOSURE SKIN 1/2X4 (GAUZE/BANDAGES/DRESSINGS) ×2 IMPLANT
SUT MAXON ABS #0 GS21 30IN (SUTURE) ×6 IMPLANT
SUT PLAIN 2 0 (SUTURE)
SUT PLAIN 2 0 XLH (SUTURE) ×3 IMPLANT
SUT PLAIN ABS 2-0 CT1 27XMFL (SUTURE) IMPLANT
SUT VIC AB 1 CT1 36 (SUTURE) ×6 IMPLANT
SUT VIC AB 2-0 CT1 36 (SUTURE) ×3 IMPLANT
SUT VIC AB 4-0 FS2 27 (SUTURE) ×3 IMPLANT
SYRINGE 10CC LL (SYRINGE) ×3 IMPLANT

## 2015-04-22 NOTE — Progress Notes (Signed)
L&D Progress Note  Had a sudden wave of nausea and vomited a large amt at 0200. There was a fetal heart deceleration as she vomited down to 60 BPM x 2 min with a gradual rise to baseline, remaining in the 100s x 3-4 minutes before returning to 120 baseline. O2 applied, position changed and IV bolus given. Currently baseline 120 with accelerations to 150s to 160s with moderate variability. Contractions q2-3 min apart? Cervix 7/80%/-2. Last significant deceleration was 3 hours ago.  A: Occasional prolonged deceleration in an otherwise reactive strip Continues to progress on her own  P: Zofran for nausea prn Continue to monitor progress and fetal well being Consider AROM if baby's station becomes a little lower or Pitocin if stops progressing. Hailey Jones, Hailey Jones, CNM

## 2015-04-22 NOTE — Progress Notes (Signed)
L&D Progress Note   S: Strong contractions and bloody show   O: 128/74 Had a 2 min FHR deceleration to 50 at 2300 and another 2 min deceleration to 70 at 2330 Currently baseline 130 with accelerations to 160s to 170s, moderate variability Cervix has dilated from 4.5 cm at 2330 to 5.5cm/70-80%/-2 now Contractions q2-3 min apart  A: Progressing Occasional prolonged decelerations in an otherwise reactive strip Normotensive  P: Continue to monitor progress and FWB Will continue expectant management since patient is progressing Daisean Brodhead, CNM

## 2015-04-22 NOTE — Anesthesia Procedure Notes (Signed)
Spinal Patient location during procedure: OR Staffing Anesthesiologist: Hailey Jones, Hailey Jones Performed by: anesthesiologist  Preanesthetic Checklist Completed: patient identified, site marked, surgical consent, pre-op evaluation, timeout performed, IV checked, risks and benefits discussed and monitors and equipment checked Spinal Block Patient position: sitting Prep: Betadine and site prepped and draped Patient monitoring: heart rate, cardiac monitor, continuous pulse ox and blood pressure Approach: midline Location: L3-4 Injection technique: single-shot Needle Needle type: Whitacre  Needle gauge: 25 G Assessment Sensory level: T4 Additional Notes Time ou called.  Patient placed in sitting position and spinal as above under sterile prep and drape.  No blood and no paresthesias.  12 .5 mg of spinal Marcaine + epi wash with 0.2 mg of Astromorph.  Patient tolerated the procedure well.  Level to T4T4 by pin prick and cold.  Neg alice test

## 2015-04-22 NOTE — Anesthesia Preprocedure Evaluation (Signed)
Anesthesia Evaluation  Patient identified by MRN, date of birth, ID band Patient awake    Reviewed: Allergy & Precautions, H&P , NPO status , Patient's Chart, lab work & pertinent test results, reviewed documented beta blocker date and time   History of Anesthesia Complications Negative for: history of anesthetic complications  Airway Mallampati: II  TM Distance: >3 FB Neck ROM: full    Dental no notable dental hx. (+) Chipped   Pulmonary neg pulmonary ROS,    Pulmonary exam normal        Cardiovascular hypertension, Normal cardiovascular exam  PIH   Neuro/Psych negative neurological ROS  negative psych ROS   GI/Hepatic negative GI ROS, Neg liver ROS,   Endo/Other  negative endocrine ROS  Renal/GU negative Renal ROS  negative genitourinary   Musculoskeletal   Abdominal   Peds  Hematology   Anesthesia Other Findings   Reproductive/Obstetrics (+) Pregnancy                             Anesthesia Physical Anesthesia Plan  ASA: II and emergent  Anesthesia Plan: Spinal   Post-op Pain Management:    Induction:   Airway Management Planned: Nasal Cannula  Additional Equipment:   Intra-op Plan:   Post-operative Plan:   Informed Consent: I have reviewed the patients History and Physical, chart, labs and discussed the procedure including the risks, benefits and alternatives for the proposed anesthesia with the patient or authorized representative who has indicated his/her understanding and acceptance.   Dental advisory given  Plan Discussed with: CRNA and Surgeon  Anesthesia Plan Comments: (Epidural not working well, so we will do spinal + narcotics for post op pain relief... Patient and family in agreement)        Anesthesia Quick Evaluation

## 2015-04-22 NOTE — Transfer of Care (Signed)
Immediate Anesthesia Transfer of Care Note  Patient: Hailey Jones  Procedure(s) Performed: Procedure(s): CESAREAN SECTION (N/A)  Patient Location: PACU and Antenatal  Anesthesia Type:Spinal  Level of Consciousness: awake, alert  and oriented  Airway & Oxygen Therapy: Patient Spontanous Breathing  Post-op Assessment: Report given to RN and Post -op Vital signs reviewed and stable  Post vital signs: Reviewed and stable  Last Vitals: 10:42 118/74 Bp 97.6 temp 99% sat 20resp 68 hr  Filed Vitals:   04/22/15 0827  BP: 115/62  Pulse: 65  Temp:   Resp:     Complications: No apparent anesthesia complications

## 2015-04-22 NOTE — Op Note (Signed)
Operative Note   SURGERY DATE: 04/22/2015  PRE-OP DIAGNOSIS:  *Intrauterine pregnancy @ 40/4 *Fetal intolerance of labor (recurrent late decelerations) remote from delivery (7-8cm) *IOL for gestational hypertension *BMI 33 *Rh negative  POST-OP DIAGNOSIS: Same. Fetal hypospadias   PROCEDURE: primary low transverse cesarean section via pfannenstiel skin incision with double layer uterine closure  SURGEON: Surgeon(s) and Role:    * [place] Bingharlie Jodee Wagenaar, MD - Primary  ASSISTANT:    Vena Austria* Andreas Staebler, MD  ANESTHESIA: spinal  ESTIMATED BLOOD LOSS: 700mL  DRAINS: 250mL UOP via indwelling foley  TOTAL IV FLUIDS: 1400mL crystalloid  VTE Prophylaxis: SCDs to bilateral lower extremities  Antibiotics: Two grams of Cefazolin were given., within 1 hour of skin incision  SPECIMENS: none  COMPLICATIONS: none  FINDINGS: No intra-abdominal adhesions were noted. Grossly normal uterus, tubes and ovaries. Mild to moderately stained meconium stained amniotic fluid, nuchal cord (loose) x 1, cephalic female infant, weight 4010gm, APGARs 8/10, intact placenta.  PROCEDURE IN DETAIL: The patient was taken to the operating room, and as she was sitting up for her spinal, another late deceleration to the 60s for 1-2 minutes occurred. She was placed in dorsal supine and the fetus recovered and was normal baseline with moderate variability for approximately 5 minutes.  Decision was made to try for spinal again, which was successful. She was then prepped and draped in the normal fashion in the dorsal supine position with a leftward tilt.  After a time out was performed, a pfannensteil  skin incision was made with the scalpel and carried through to the underlying layer of fascia. The fascia was then incised at the midline and this incision was extended laterally with the mayo scissors. Attention was turned to the superior aspect of the fascial incision which was grasped with the kocher clamps x 2, tented up and the  rectus muscles were dissected off with the bovie. In a similar fashion the inferior aspect of the fascial incision was grasped with the kocher clamps, tented up and the rectus muscles dissected off with the mayo scissors. The rectus muscles were then separated in the midline and the peritoneum was entered bluntly. The bladder blade was inserted and the vesicouterine peritoneum was identified, tented up and entered with the metzenbaum scissors. This incision was extended laterally and the bladder flap was created digitally. The bladder blade was reinserted.  A low transverse hysterotomy was made with the scalpel until the endometrial cavity was breached and the amniotic sac ruptured with the Allis clamp, yielding mild to moderately meconium stained amniotic fluid. This incision was extended bluntly and the infant's head, shoulders and body were delivered atraumatically.The cord was clamped x 2 and cut, and the infant was handed to the awaiting pediatricians.  The placenta was then gradually expressed from the uterus and then the uterus was exteriorized and cleared of all clots and debris. The hysterotomy was repaired with a running suture of 1-0 monocryl. A second imbricating layer of 1-0 monocryl suture was then placed for excellent hemostasis.   The uterus and adnexa were then returned to the abdomen, and the hysterotomy and all operative sites were reinspected and excellent hemostasis was noted after irrigation and suction of the abdomen with warm saline.  The peritoneum was closed with a running stitch of 3-0 vicryl. The fascia was reapproximated with 0 vicryl in a simple running fashion bilaterally and each stitch tied in the middle (separately. The subcutaneous layer was then reapproximated with interrupted sutures of 2-0 plain gut, and the  skin was then closed with staples  The patient  tolerated the procedure well. Sponge, lap, needle, and instrument counts were correct x 2. The patient was  transferred to the recovery room awake, alert and breathing independently in stable condition.  Cornelia Copa MD [facility] OBGYN Pager [phone]

## 2015-04-22 NOTE — Discharge Instructions (Addendum)
Cesarean Delivery, Care After Refer to this sheet in the next few weeks. These instructions provide you with information on caring for yourself after your procedure. Your health care provider may also give you specific instructions. Your treatment has been planned according to current medical practices, but problems sometimes occur. Call your health care provider if you have any problems or questions after you go home. HOME CARE INSTRUCTIONS  Only take over-the-counter or prescription medications as directed by your health care provider.  Do not drink alcohol, especially if you are breastfeeding or taking medication to relieve pain.  Do not chew or smoke tobacco.  Continue to use good perineal care. Good perineal care includes:  Wiping your perineum from front to back.  Keeping your perineum clean.  Check your surgical cut (incision) daily for increased redness, drainage, swelling, or separation of skin.  Clean your incision gently with soap and water every day, and then pat it dry. If your health care provider says it is okay, leave the incision uncovered. Use a bandage (dressing) if the incision is draining fluid or appears irritated. If the adhesive strips across the incision do not fall off within 7 days, carefully peel them off.  Hug a pillow when coughing or sneezing until your incision is healed. This helps to relieve pain.  Do not use tampons, douche, or have sexual intercourse for the next 6 weeks.   Showers only, no tub baths for the next 6 weeks.   Wear a well-fitting bra that provides breast support.  Limit wearing support panties or control-top hose.  Drink enough fluids to keep your urine clear or pale yellow.  Eat high-fiber foods such as whole grain cereals and breads, brown rice, beans, and fresh fruits and vegetables every day. These foods may help prevent or relieve constipation.  Resume activities such as climbing stairs, driving, lifting, exercising, or  traveling as directed by your health care provider.  Try to have someone help you with your household activities and your newborn for at least a few days after you leave the hospital.  Rest as much as possible. Try to rest or take a nap when your newborn is sleeping.  Increase your activities gradually.  Keep all of your scheduled postpartum appointments. It is very important to keep your scheduled follow-up appointments. At these appointments, your health care provider will be checking to make sure that you are healing physically and emotionally. SEEK MEDICAL CARE IF:   You are passing large clots from your vagina. Save any clots to show your health care provider.  You have a foul smelling discharge from your vagina.  You have trouble urinating.  You are urinating frequently.  You have pain when you urinate.  You have a change in your bowel movements.  You have increasing redness, pain, or swelling near your incision.  You have pus draining from your incision.  Your incision is separating.  You have painful, hard, or reddened breasts.  You have a severe headache.  You have blurred vision or see spots.  You feel sad or depressed.  You have thoughts of hurting yourself or your newborn.  You have questions about your care, the care of your newborn, or medications.  You are dizzy or light-headed.  You have a rash.  You have pain, redness, or swelling at the site of the removed intravenous access (IV) tube.  You have nausea or vomiting.  You stopped breastfeeding and have not had a menstrual period within 12 weeks of  stopping.  You are not breastfeeding and have not had a menstrual period within 12 weeks of delivery.  You have a fever. SEEK IMMEDIATE MEDICAL CARE IF:  You have persistent pain.  You have chest pain.  You have shortness of breath.  You faint.  You have leg pain.  You have stomach pain.  Your vaginal bleeding saturates 2 or more sanitary  pads in 1 hour. MAKE SURE YOU:   Understand these instructions.  Will watch your condition.  Will get help right away if you are not doing well or get worse.   This information is not intended to replace advice given to you by your health care provider. Make sure you discuss any questions you have with your health care provider.   Document Released: 02/11/2002 Document Revised: 06/12/2014 Document Reviewed: 01/17/2012 Elsevier Interactive Patient Education [employer]

## 2015-04-22 NOTE — Progress Notes (Addendum)
L&D Progress Note  S: Nausea has subsided. Contractions have spaced out since her last Stadol at 0230  O: Resting on right lateral position, appears comfortable BP 132/74 mmHg  Pulse 97  Temp(Src) 98.6 F (37 C) (Oral)  Resp 18  Ht 5\' 3"  (1.6 m)  Wt 183 lb (83.008 kg)  BMI 32.43 kg/m2  FHR baseline is 125 with accelerations to 150 Contractions ?q7 min apart Cervix: 7/80%/-2 with more bloody show (no change over 3 hours) A: Inadequate labor Cat 1 tracing  P: Pitocin augmentation Hailey Jones, CNM

## 2015-04-22 NOTE — Discharge Summary (Signed)
Obstetrical Discharge Summary  Date of Admission: 04/21/2015 Date of Discharge: 04/25/2015  Primary OB: [facility]  Gestational Age at Delivery: [id number]   Antepartum complications: BMI 33, Rh negative, ? hypothyroidism Reason for Admission: IOL for new diagnosis of gestational hypertension Date of Delivery: 04/22/2015 Delivered By: Cornelia Copaharlie Pickens, Jr MD Delivery Type: primary cesarean section, low transverse incision Intrapartum complications/course: recurrent late decelerations remote from delivery Anesthesia: spinal Placenta: expressed. Intact: yes. To pathology: no.  Laceration: none Episiotomy: none Baby: Liveborn female, APGARs8/10, weight 4010 g. (8#13.4oz) Hailey Jones/ Breast  Postpartum course: Patient had a postpartum course complicated by anemia, but was hemodynamicallystable. By the time of her discharge on POD #3, her pain was well controlled on oral analgesics, she had appropriate lochia, and was ambulating, voiding without difficulty, tolerating a regular diet and passing flatus. She was deemed stable for discharge to home. Discharge Vital Signs:  Current Vital Signs 24h Vital Sign Ranges  T 98 F (36.7 C) Temp  Avg: 98.5 F (36.9 C)  Min: 97.8 F (36.6 C)  Max: 99.4 F (37.4 C)  BP 136/89 mmHg BP  Min: 120/75  Max: 138/90  HR 88 Pulse  Avg: 85.8  Min: 72  Max: 98  RR 18 Resp  Avg: 18.4  Min: 18  Max: 20  SaO2 99 % Not Delivered SpO2  Avg: 97.6 %  Min: 97 %  Max: 99 %       24 Hour I/O Current Shift I/O  Time Ins Outs         Patient Vitals for the past 6 hrs:  BP Temp Temp src Pulse Resp SpO2  04/25/15 0806 136/89 mmHg 98 F (36.7 C) Oral 88 18 99 %    Discharge Exam:  NAD, passing flatus RRR without murmur CTAB Abdomen: firm fundus below the umbilicus. Incision c/d/i, staples were removed and steristrips were applied. BS active, soft, ND Ext: no evidence of DVT, no edema  Disposition: Home  Rh Immune globulin given: no, baby A neg Rubella vaccine  given: not applicable Tdap vaccine given in AP or PP setting: yes Flu vaccine given in AP or PP setting: ordered  Contraception: none  Prenatal/Postnatal Panel: O NEG//Rubella Immune//Varicella Immune//RPR negative//HIV negative/HepB Surface Ag negative//pap neg (date: 2016)//plans to breastfeed  Plan:  Hailey Jones was discharged to home in good condition. Follow-up appointment with Dr. Vergie LivingPickens in 1 week  Discharge Medications:   Medication List    STOP taking these medications        promethazine 25 MG tablet  Commonly known as:  PHENERGAN      TAKE these medications        docusate sodium 100 MG capsule  Commonly known as:  COLACE  Take 1 capsule (100 mg total) by mouth 2 (two) times daily as needed for mild constipation.     ibuprofen 600 MG tablet  Commonly known as:  ADVIL,MOTRIN  Take 1 tablet (600 mg total) by mouth every 6 (six) hours as needed for headache, mild pain, moderate pain or cramping.     lanolin Oint  Apply 1 application topically as needed (for breast care).     multivitamin-prenatal 27-0.8 MG Tabs tablet  Take 1 tablet by mouth daily at 12 noon.     oxyCODONE-acetaminophen 5-325 MG tablet  Commonly known as:  PERCOCET/ROXICET  Take 1-2 tablets by mouth every 6 (six) hours as needed for moderate pain or severe pain.       Resume iron supplement at home Hailey Jones,  CNM

## 2015-04-22 NOTE — Anesthesia Preprocedure Evaluation (Addendum)
Anesthesia Evaluation  Patient identified by MRN, date of birth, ID band Patient awake    Reviewed: Allergy & Precautions, H&P , NPO status , Patient's Chart, lab work & pertinent test results, reviewed documented beta blocker date and time   History of Anesthesia Complications Negative for: history of anesthetic complications  Airway Mallampati: II  TM Distance: >3 FB Neck ROM: full    Dental no notable dental hx. (+) Chipped   Pulmonary neg pulmonary ROS,    Pulmonary exam normal        Cardiovascular hypertension, Normal cardiovascular exam  PIH   Neuro/Psych negative neurological ROS  negative psych ROS   GI/Hepatic negative GI ROS, Neg liver ROS,   Endo/Other  negative endocrine ROS  Renal/GU negative Renal ROS  negative genitourinary   Musculoskeletal   Abdominal   Peds  Hematology   Anesthesia Other Findings   Reproductive/Obstetrics (+) Pregnancy                            Anesthesia Physical Anesthesia Plan  ASA: II  Anesthesia Plan: Epidural   Post-op Pain Management:    Induction:   Airway Management Planned:   Additional Equipment:   Intra-op Plan:   Post-operative Plan:   Informed Consent: I have reviewed the patients History and Physical, chart, labs and discussed the procedure including the risks, benefits and alternatives for the proposed anesthesia with the patient or authorized representative who has indicated his/her understanding and acceptance.     Plan Discussed with: CRNA and Anesthesiologist  Anesthesia Plan Comments:        Anesthesia Quick Evaluation

## 2015-04-22 NOTE — Anesthesia Procedure Notes (Signed)
Epidural Patient location during procedure: OB Start time: 04/22/2015 7:57 AM End time: 04/22/2015 7:59 AM  Staffing Resident/CRNA: Stormy FabianURTIS, Shalaine Payson  Preanesthetic Checklist Completed: patient identified, site marked, surgical consent, pre-op evaluation, timeout performed, IV checked, risks and benefits discussed and monitors and equipment checked  Epidural Patient position: sitting Prep: Betadine Patient monitoring: heart rate, continuous pulse ox and blood pressure Approach: midline Location: L4-L5 Injection technique: LOR air  Needle:  Needle type: Tuohy  Needle gauge: 18 G Needle length: 9 cm and 9 Needle insertion depth: 5 cm Catheter type: closed end flexible Catheter size: 20 Guage Catheter at skin depth: 10 cm Test dose: negative and 1.5% lidocaine with Epi 1:200 K  Assessment Sensory level: T10 Events: blood not aspirated, injection not painful, no injection resistance, negative IV test and no paresthesia  Additional Notes Pt's history reviewed and consent obtained as per OB consent Patient tolerated the insertion well without complications. Negative SATD, negative IVTD All VSS were obtained and monitored through OBIX and nursing protocols followed.Reason for block:procedure for pain

## 2015-04-22 NOTE — Progress Notes (Signed)
L&D Note  04/22/2015 - 8:40 AM  24 y.o. G1 [redacted]w[redacted]d. Pregnancy complicated by BMI 33, Rh negative, ?hypothyroidism, maternal half-sister with dandy walker   [name] is admitted for IOL for gHTN   Subjective:  Comfortable with epidural in place   Objective:   Filed Vitals:   04/22/15 0824 04/22/15 0825 04/22/15 0827 04/22/15 0830  BP: 78/59  115/62   Pulse: 73  65   Temp:      TempSrc:      Resp:      Height:      Weight:      SpO2:  100%  100%    Current Vital Signs 24h Vital Sign Ranges  T 98.1 F (36.7 C) Temp  Avg: 98.3 F (36.8 C)  Min: 98.1 F (36.7 C)  Max: 98.6 F (37 C)  BP 115/62 mmHg BP  Min: 78/59  Max: 138/79  HR 65 Pulse  Avg: 77.4  Min: 65  Max: 97  RR 18 Resp  Avg: 17.6  Min: 16  Max: 18  SaO2 100 % Simple Mask SpO2  Avg: 100 %  Min: 100 %  Max: 100 %       24 Hour I/O Current Shift I/O  Time Ins Outs       FHR: 125 baseline, +accels, +decels (see below plan), mod variability Toco: q5-40m Gen: NAD, pt laying LLR with O2 mask on  SVE: 7-8/80/0, FSE and IUPC in place,  Mild caput  Recent Labs Lab 04/19/15 1634 04/21/15 1637  WBC 8.7 9.0  HGB 9.6* 9.8*  HCT 30.1* 30.2*  PLT 226 211    Recent Labs Lab 04/19/15 1634 04/21/15 1637  NA 135 138  K 3.7 3.7  CL 109 108  CO2 22 21*  BUN 13 13  CREATININE 0.70 0.58  CALCIUM 9.0 9.1  PROT 6.1* 6.3*  BILITOT 0.4 0.5  ALKPHOS 144* 159*  ALT 10* 10*  AST 20 17  GLUCOSE 93 87    Medications Current Facility-Administered Medications  Medication Dose Route Frequency Provider Last Rate Last Dose  . ammonia inhalant 0.3 mL  0.3 mL Inhalation Once PRN Farrel Conners, CNM      . ammonia inhalant           . butorphanol (STADOL) injection 1 mg  1 mg Intravenous Q1H PRN Farrel Conners, CNM   1 mg at 04/22/15 1610  . ceFAZolin (ANCEF) IVPB 2 g/50 mL premix  2 g Intravenous 30 min Pre-Op [place] Bing, MD      . citric acid-sodium citrate (ORACIT) 334-500 MG/5ML solution           .  ePHEDrine 5 MG/ML injection           . lactated ringers infusion 500-1,000 mL  500-1,000 mL Intravenous PRN Farrel Conners, CNM   Stopped at 04/22/15 0810  . lactated ringers infusion   Intravenous Continuous Farrel Conners, CNM   Stopped at 04/22/15 0827  . lidocaine (PF) (XYLOCAINE) 1 % injection 30 mL  30 mL Subcutaneous PRN Farrel Conners, CNM      . lidocaine (PF) (XYLOCAINE) 1 % injection           . misoprostol (CYTOTEC) 200 MCG tablet           . misoprostol (CYTOTEC) tablet 800 mcg  800 mcg Rectal Once PRN Farrel Conners, CNM      . ondansetron (ZOFRAN) injection 4 mg  4 mg Intravenous Q6H PRN Farrel Conners, CNM  4 mg at 04/22/15 0220  . oxytocin (PITOCIN) 10 UNIT/ML injection           . oxytocin (PITOCIN) IV BOLUS FROM BAG  500 mL Intravenous Continuous Farrel Connersolleen Gutierrez, CNM      . oxytocin (PITOCIN) IV infusion 40 units in LR 1000 mL  62.5 mL/hr Intravenous Continuous Farrel Connersolleen Gutierrez, CNM      . oxytocin (PITOCIN) IV infusion 40 units in LR 1000 mL  1-40 milli-units/min Intravenous Titrated Farrel Connersolleen Gutierrez, CNM   Stopped at 04/22/15 0700  . terbutaline (BRETHINE) injection 0.25 mg  0.25 mg Subcutaneous Once PRN Farrel Connersolleen Gutierrez, CNM      . terbutaline (BRETHINE) injection 0.25 mg  0.25 mg Subcutaneous Once PRN Farrel Connersolleen Gutierrez, CNM       Facility-Administered Medications Ordered in Other Encounters  Medication Dose Route Frequency Provider Last Rate Last Dose  . bupivacaine (PF) (MARCAINE) 0.25 % injection    Anesthesia Intra-op Stormy FabianLinda Curtis, CRNA   4 mL at 04/22/15 0809  . lidocaine (PF) (XYLOCAINE) 1 % injection    Anesthesia Intra-op Stormy FabianLinda Curtis, CRNA   3 mL at 04/22/15 0757  . lidocaine-EPINEPHrine 1.5 %-1:200000 injection    Anesthesia Intra-op Stormy FabianLinda Curtis, CRNA   3 mL at 04/22/15 0801    Assessment & Plan:  Pt stable *IUP: currently category I with accels. Patient had late decel at 0823 x 1 minute to 90s. Prior to this she had an approximately 2261m  long decel at 0700, which may have been due to tachysystole (difficult to tell from [facility]). Given that she is not change her cervix, inability to augment with pitocin b/c the fetus isn't tolerating spaced out contractions very well, she's a nullip (with potential for several hours to get to complete and then possible more time for pushing and delivery, with more fetal stress with the latter) I told her that I recommend a primary c-section for fetal intolerance of labor. Fetus and mom status are currently reassuring so will proceed with urgent c-section. Mom and dad are amenable to plan *GBS: neg *gHTN: s/p negative pre-eclampsia w/u *Maternal FHx of D-W Syndrome: current fetus with normal anatomy u/s *Analgesia: no needs  [place] Bingharlie Rafay Dahan, Montez HagemanJr MD [facility] Pager [phone]

## 2015-04-23 ENCOUNTER — Encounter: Payer: Self-pay | Admitting: Obstetrics and Gynecology

## 2015-04-23 LAB — HEMATOCRIT: HCT: 22.8 % — ABNORMAL LOW (ref 35.0–47.0)

## 2015-04-23 NOTE — Anesthesia Postprocedure Evaluation (Cosign Needed)
  Anesthesia Post-op Note  Patient: Hailey Jones  Procedure(s) Performed: Procedure(s): CESAREAN SECTION (N/A)  Anesthesia type:Spinal  Patient location: 339  Post pain: Pain level controlled  Post assessment: Post-op Vital signs reviewed, Patient's Cardiovascular Status Stable, Respiratory Function Stable, Patent Airway and No signs of Nausea or vomiting  Post vital signs: Reviewed and stable  Last Vitals:  Filed Vitals:   04/23/15 0729  BP: 129/70  Pulse: 86  Temp: 36.9 C  Resp: 18    Level of consciousness: awake, alert  and patient cooperative  Complications: No apparent anesthesia complications

## 2015-04-23 NOTE — Anesthesia Post-op Follow-up Note (Signed)
  Anesthesia Pain Follow-up Note  Patient: Hailey Jones  Day #: 1  Date of Follow-up: 04/23/2015 Time: 7:42 AM  Last Vitals:  Filed Vitals:   04/23/15 0729  BP: 129/70  Pulse: 86  Temp: 36.9 C  Resp: 18    Level of Consciousness: alert  Pain: none   Side Effects:None  Catheter Site Exam:clean, dry, no drainage  Plan: D/C from anesthesia care  Chong SicilianLopez,  Quanika Solem

## 2015-04-23 NOTE — Anesthesia Post-op Follow-up Note (Signed)
  Anesthesia Pain Follow-up Note  Patient: Hailey Jones  Day #: 1  Date of Follow-up: 04/23/2015 Time: 7:40 AM  Last Vitals:  Filed Vitals:   04/23/15 0729  BP: 129/70  Pulse: 86  Temp: 36.9 C  Resp: 18    Level of Consciousness: alert  Pain: none   Side Effects:None  Catheter Site Exam:clean, dry, no drainage  Plan: D/C from anesthesia care  Chong SicilianLopez,  Mayari Matus

## 2015-04-23 NOTE — Progress Notes (Signed)
  Subjective:   Doing well.  No complaints.  Voiding, ambulating, tolerating regular PO diet, tolerating pain with PO meds. Denies: CP SOB F/C, N/V, calf pain    Objective:  Blood pressure 129/70, pulse 86, temperature 98.5 F (36.9 C), temperature source Oral, resp. rate 18, height 5\' 3"  (1.6 m), weight 83.008 kg (183 lb), SpO2 96 %, unknown if currently breastfeeding.  General: NAD Pulmonary: no increased work of breathing Abdomen: non-distended, non-tender, fundus firm at level of umbilicus Incision: bandaged, c/d/i.  Extremities: no edema, no erythema, no tenderness  Results for orders placed or performed during the hospital encounter of 04/21/15 (from the past 24 hour(s))  Hematocrit     Status: Abnormal   Collection Time: 04/23/15  6:15 AM  Result Value Ref Range   HCT 22.8 (L) 35.0 - 47.0 %     Assessment:   24 y.o. G1P1001 postoperativeday # 1   Plan:  1) Acute blood loss anemia - hemodynamically stable and asymptomatic - po ferrous sulfate.    2) continue routine post-op cares  3) TDAP status UTD  4) Breastfeeding  5) Disposition - continue inpatient care.   ----- Ranae Plumberhelsea Ward, MD Attending Obstetrician and Gynecologist [facility]

## 2015-04-24 MED ORDER — FERROUS FUMARATE 325 (106 FE) MG PO TABS
1.0000 | ORAL_TABLET | Freq: Every day | ORAL | Status: DC
  Administered 2015-04-24: 106 mg via ORAL
  Administered 2015-04-25: 1 via ORAL
  Filled 2015-04-24 (×2): qty 1

## 2015-04-24 MED ORDER — DOCUSATE SODIUM 100 MG PO CAPS
100.0000 mg | ORAL_CAPSULE | Freq: Every day | ORAL | Status: DC
  Administered 2015-04-24 – 2015-04-25 (×2): 100 mg via ORAL
  Filled 2015-04-24 (×2): qty 1

## 2015-04-24 NOTE — Progress Notes (Signed)
Subjective:   Denies lightheadedness, palpitations. OOB ambulating. Going to take a shower later today. Tolerating regular diet.   Objective:  Blood pressure 132/82, pulse 74, temperature 98.3 F (36.8 C), temperature source Oral, resp. rate 18, height '5\' 3"'  (1.6 m), weight 183 lb (83.008 kg), SpO2 98 %, unknown if currently breastfeeding.  General: NAD Pulmonary: no increased work of breathing/ CTA Heart: RRR without murmur Abdomen: non-distended, non-tender, BS active Incision: dressing C+D+I Extremities: no edema, no erythema, no tenderness  Results for orders placed or performed during the hospital encounter of 04/21/15 (from the past 72 hour(s))  TSH     Status: None   Collection Time: 04/21/15  4:37 PM  Result Value Ref Range   TSH 2.828 0.350 - 4.500 uIU/mL  T4, free     Status: None   Collection Time: 04/21/15  4:37 PM  Result Value Ref Range   Free T4 0.70 0.61 - 1.12 ng/dL  CBC     Status: Abnormal   Collection Time: 04/21/15  4:37 PM  Result Value Ref Range   WBC 9.0 3.6 - 11.0 K/uL   RBC 3.74 (L) 3.80 - 5.20 MIL/uL   Hemoglobin 9.8 (L) 12.0 - 16.0 g/dL   HCT 30.2 (L) 35.0 - 47.0 %   MCV 80.7 80.0 - 100.0 fL   MCH 26.3 26.0 - 34.0 pg   MCHC 32.6 32.0 - 36.0 g/dL   RDW 17.0 (H) 11.5 - 14.5 %   Platelets 211 150 - 440 K/uL  RPR     Status: None   Collection Time: 04/21/15  4:37 PM  Result Value Ref Range   RPR Ser Ql Non Reactive Non Reactive    Comment: (NOTE) Performed At: [facility] [place], [place] [id number] Lindon Romp MD [id number]   Comprehensive metabolic panel     Status: Abnormal   Collection Time: 04/21/15  4:37 PM  Result Value Ref Range   Sodium 138 135 - 145 mmol/L   Potassium 3.7 3.5 - 5.1 mmol/L   Chloride 108 101 - 111 mmol/L   CO2 21 (L) 22 - 32 mmol/L   Glucose, Bld 87 65 - 99 mg/dL   BUN 13 6 - 20 mg/dL   Creatinine, Ser 0.58 0.44 - 1.00 mg/dL   Calcium 9.1 8.9 - 10.3 mg/dL   Total Protein 6.3 (L)  6.5 - 8.1 g/dL   Albumin 2.7 (L) 3.5 - 5.0 g/dL   AST 17 15 - 41 U/L   ALT 10 (L) 14 - 54 U/L   Alkaline Phosphatase 159 (H) 38 - 126 U/L   Total Bilirubin 0.5 0.3 - 1.2 mg/dL   GFR calc non Af Amer >60 >60 mL/min   GFR calc Af Amer >60 >60 mL/min    Comment: (NOTE) The eGFR has been calculated using the CKD EPI equation. This calculation has not been validated in all clinical situations. eGFR's persistently <60 mL/min signify possible Chronic Kidney Disease.    Anion gap 9 5 - 15  Protein / creatinine ratio, urine     Status: Abnormal   Collection Time: 04/21/15  4:40 PM  Result Value Ref Range   Creatinine, Urine 229 mg/dL   Total Protein, Urine 90 mg/dL    Comment: NO NORMAL RANGE ESTABLISHED FOR THIS TEST   Protein Creatinine Ratio 0.39 (H) 0.00 - 0.15 mg/mg[Cre]  Type and screen     Status: None   Collection Time: 04/21/15  4:43 PM  Result Value  Ref Range   ABO/RH(D) O NEG    Antibody Screen NEG    Sample Expiration 04/24/2015   Chlamydia/NGC rt PCR ([place] only)     Status: None   Collection Time: 04/21/15  5:34 PM  Result Value Ref Range   Specimen source GC/Chlam URINE, RANDOM    Chlamydia Tr NOT DETECTED NOT DETECTED   N gonorrhoeae NOT DETECTED NOT DETECTED    Comment: (NOTE) 100  This methodology has not been evaluated in pregnant women or in 200  patients with a history of hysterectomy. 300 400  This methodology will not be performed on patients less than 12  years of age.   Hematocrit     Status: Abnormal   Collection Time: 04/23/15  6:15 AM  Result Value Ref Range   HCT 22.8 (L) 35.0 - 47.0 %     Assessment:   24 y.o. G1P1001 postoperativeday # 2   Plan:  1) Acute blood loss anemia - hemodynamically stable and asymptomatic - po iron supplement/ vitamins  Repeat CBC in AM  Safety precautions discussed  2) --/--/O NEG (11/16 1643) / Immune (04/15 0000) / Varicella Immune  Baby A neg-Rhogam not indicated  3) TDAP UTD   4) Breast   5)  Disposition-DC tomorrow  Dalia Heading, CNM

## 2015-04-24 NOTE — Anesthesia Postprocedure Evaluation (Signed)
  Anesthesia Post-op Note  Patient: Hailey Jones  Procedure(s) Performed: * No procedures listed *  Anesthesia type:Epidural  Patient location: PACU  Post pain: Pain level controlled  Post assessment: Post-op Vital signs reviewed, Patient's Cardiovascular Status Stable, Respiratory Function Stable, Patent Airway and No signs of Nausea or vomiting  Post vital signs: Reviewed and stable  Last Vitals:  Filed Vitals:   04/24/15 0406  BP: 132/80  Pulse: 70  Temp: 36.7 C  Resp: 20    Level of consciousness: awake, alert  and patient cooperative  Complications: No apparent anesthesia complications

## 2015-04-25 LAB — CBC
HEMATOCRIT: 25.1 % — AB (ref 35.0–47.0)
HEMOGLOBIN: 8.1 g/dL — AB (ref 12.0–16.0)
MCH: 25.9 pg — AB (ref 26.0–34.0)
MCHC: 32.2 g/dL (ref 32.0–36.0)
MCV: 80.5 fL (ref 80.0–100.0)
Platelets: 189 10*3/uL (ref 150–440)
RBC: 3.11 MIL/uL — AB (ref 3.80–5.20)
RDW: 16.3 % — ABNORMAL HIGH (ref 11.5–14.5)
WBC: 7.2 10*3/uL (ref 3.6–11.0)

## 2015-04-25 MED ORDER — LANOLIN HYDROUS EX OINT: 1.0000 "application " | TOPICAL_OINTMENT | CUTANEOUS | Status: DC | PRN

## 2015-04-25 MED ORDER — DOCUSATE SODIUM 100 MG PO CAPS: 100.0000 mg | ORAL_CAPSULE | Freq: Two times a day (BID) | ORAL | Status: DC | PRN

## 2015-04-25 MED ORDER — IBUPROFEN 600 MG PO TABS: 600.0000 mg | ORAL_TABLET | Freq: Four times a day (QID) | ORAL | Status: DC | PRN

## 2015-04-25 MED ORDER — OXYCODONE-ACETAMINOPHEN 5-325 MG PO TABS: 1.0000 | ORAL_TABLET | Freq: Four times a day (QID) | ORAL | Status: DC | PRN

## 2015-04-25 NOTE — Progress Notes (Signed)
Escorted out by auxiliary.

## 2015-04-25 NOTE — Progress Notes (Signed)
Patient discharged home with infant. Vital signs stable, bleeding within normal limits, uterus firm, incision within normal limits. Discharge instructions, prescriptions, and follow up appointment given to and reviewed with patient. Patient verbalized understanding, all questions answered.

## 2015-04-26 ENCOUNTER — Encounter: Payer: Self-pay | Admitting: Obstetrics and Gynecology

## 2016-06-29 ENCOUNTER — Ambulatory Visit: Payer: Self-pay | Admitting: Physician Assistant

## 2016-07-12 ENCOUNTER — Encounter: Payer: Self-pay | Admitting: Physician Assistant

## 2016-07-12 ENCOUNTER — Ambulatory Visit (INDEPENDENT_AMBULATORY_CARE_PROVIDER_SITE_OTHER): Payer: No Typology Code available for payment source | Admitting: Physician Assistant

## 2016-07-12 VITALS — BP 118/78 | HR 83 | Temp 97.6°F | Resp 16 | Wt 162.4 lb

## 2016-07-12 DIAGNOSIS — Z0000 Encounter for general adult medical examination without abnormal findings: Secondary | ICD-10-CM

## 2016-07-12 NOTE — Progress Notes (Signed)
Patient ID: Hailey Jones MRN: [id number], DOB: April 26, 1991, 26 y.o. Date of Encounter: @DATE @  Chief Complaint:  Chief Complaint  Patient presents with  . Annual Exam    phq score 0  .         HPI: 26 y.o. year old female  presents with above.  She presents as a new patient to establish care and also for complete physical. She has no specific complaints or concerns to address today. She has a 1317-month-old baby. She has continued follow-up with OB/GYN. States that she had Pap smear in April.   Past Medical History:  Diagnosis Date  . Medical history non-contributory   . Single delivery by C-section 04/22/2015     Home Meds: Outpatient Medications Prior to Visit  Medication Sig Dispense Refill  . ibuprofen (ADVIL,MOTRIN) 600 MG tablet Take 1 tablet (600 mg total) by mouth every 6 (six) hours as needed for headache, mild pain, moderate pain or cramping. 30 tablet 0  . docusate sodium (COLACE) 100 MG capsule Take 1 capsule (100 mg total) by mouth 2 (two) times daily as needed for mild constipation. (Patient not taking: Reported on 07/12/2016)  0  . lanolin OINT Apply 1 application topically as needed (for breast care). (Patient not taking: Reported on 07/12/2016)  0  . oxyCODONE-acetaminophen (PERCOCET/ROXICET) 5-325 MG tablet Take 1-2 tablets by mouth every 6 (six) hours as needed for moderate pain or severe pain. (Patient not taking: Reported on 07/12/2016) 30 tablet 0  . Prenatal Vit-Fe Fumarate-FA (MULTIVITAMIN-PRENATAL) 27-0.8 MG TABS tablet Take 1 tablet by mouth daily at 12 noon.     No facility-administered medications prior to visit.     Allergies: No Known Allergies  Social History   Social History  . Marital status: Married    Spouse name: N/A  . Number of children: 0  . Years of education: N/A   Occupational History  . Not on file.   Social History Main Topics  . Smoking status: Never Smoker  . Smokeless tobacco: Never Used  . Alcohol use No  . Drug use: No   . Sexual activity: Yes   Other Topics Concern  . Not on file   Social History Narrative  . No narrative on file    Family History  Problem Relation Age of Onset  . Heart disease Paternal Grandfather   . Hypertension Father   . Hypertension Maternal Grandmother   . Heart disease Maternal Grandfather   . Hypertension Paternal Grandmother   . Heart disease Paternal Grandmother      Review of Systems:  See HPI for pertinent ROS. All other ROS negative.    Physical Exam: Blood pressure 118/78, pulse 83, temperature 97.6 F (36.4 C), temperature source Oral, resp. rate 16, weight 162 lb 6.4 oz (73.7 kg), last menstrual period 06/28/2016, SpO2 98 %, currently breastfeeding., Body mass index is 28.77 kg/m. General: WNWD WF. Appears in no acute distress. Head: Normocephalic, atraumatic, eyes without discharge, sclera non-icteric, nares are without discharge. Bilateral auditory canals clear, TM's are without perforation, pearly grey and translucent with reflective cone of light bilaterally. Oral cavity moist, posterior pharynx without exudate, erythema, peritonsillar abscess.  Neck: Supple. No thyromegaly. No lymphadenopathy. Lungs: Clear bilaterally to auscultation without wheezes, rales, or rhonchi. Breathing is unlabored. Heart: RRR with S1 S2. No murmurs, rubs, or gallops. Breast Exam and Pelvic Exam---Deferred---Per Gyn-------- Abdomen: Soft, non-tender, non-distended with normoactive bowel sounds. No hepatomegaly. No rebound/guarding. No obvious abdominal masses. Musculoskeletal:  Strength and  tone normal for age. Extremities/Skin: Warm and dry.  No rashes or suspicious lesions. Neuro: Alert and oriented X 3. Moves all extremities spontaneously. Gait is normal. CNII-XII grossly in tact. Psych:  Responds to questions appropriately with a normal affect.     ASSESSMENT AND PLAN:  26 y.o. year old female with  1. Encounter for preventive health examination A. Screening Labs:    Not fasting today. She recently had lab by GYN. Will not repeat lab now.  B. Immunizations:  She received T dap with pregnancy so this is up to date. This was given around 2016. Has not received flu vaccine for this flu season and I recommended her get this especially with a 34-month-old at home but she defers. States that she understands risks versus benefits but still defers.  [address] [place], [place], [facility] 07/12/2016 3:32 PM

## 2016-08-29 IMAGING — US US SOFT TISSUE HEAD/NECK
1 series · 13 of 18 positions shown · non-contrast
Comparison: None.

CLINICAL DATA: 23-year-old female with a history of left thyroid
nodule.

The patient has a recent diagnosis of hypothyroidism, and reports
she will be initiating medical therapy.
EXAM:
THYROID ULTRASOUND
TECHNIQUE: Ultrasound examination of the thyroid gland and adjacent soft
tissues was performed.

[Series 1: us soft tissue head/neck · 0.08mm/px · 18 acquisitions, 13 frames shown]
[im 1/18]
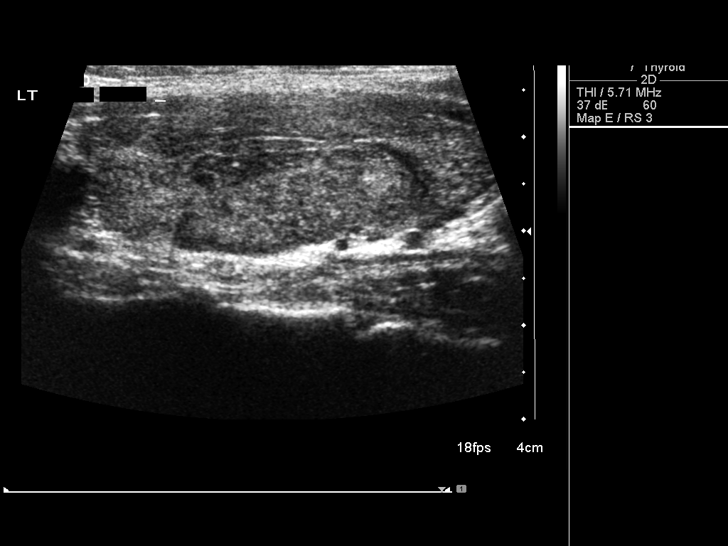
[im 3/18]
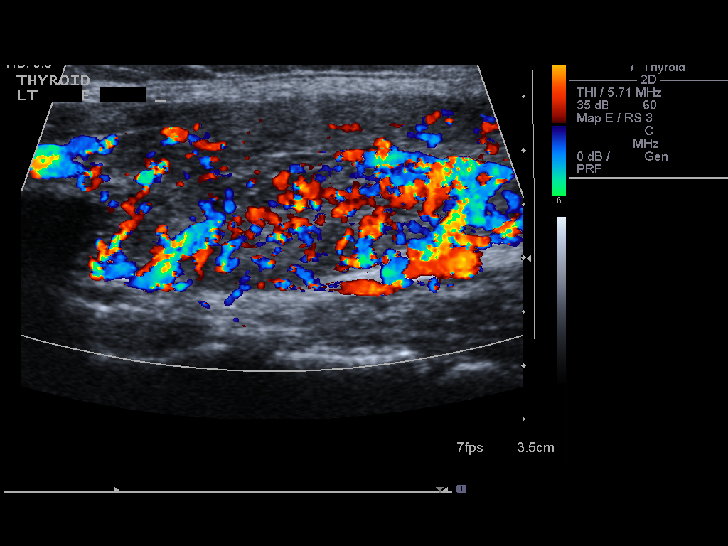
[im 4/18]
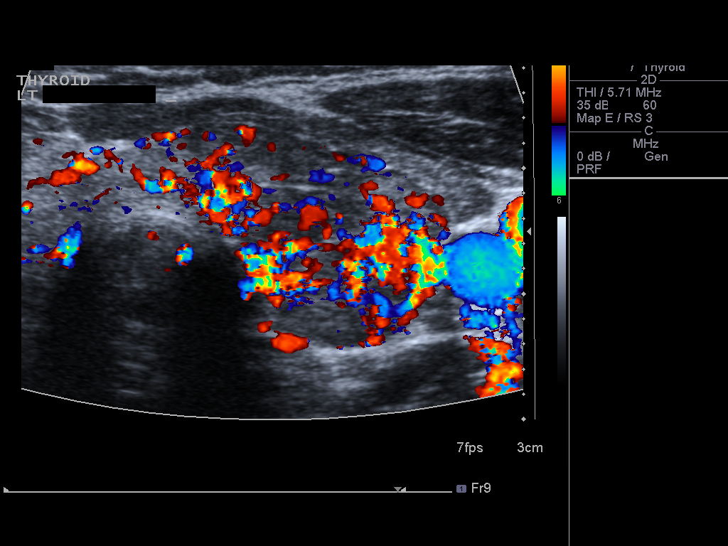
[im 5/18]
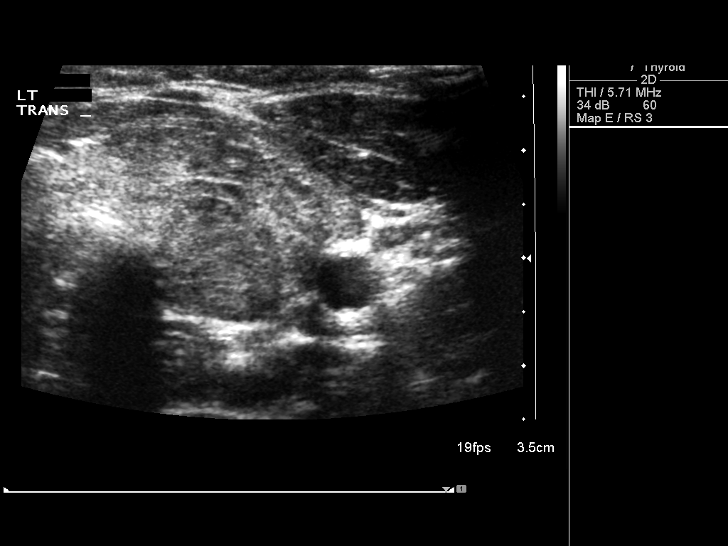
[im 7/18]
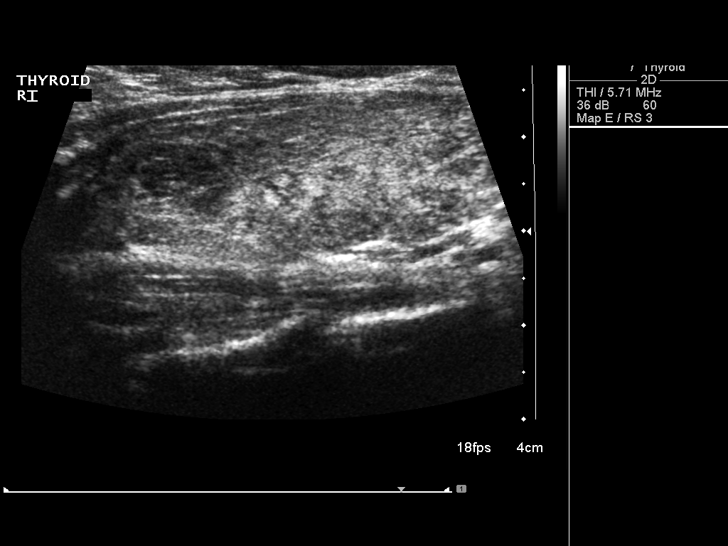
[im 8/18]
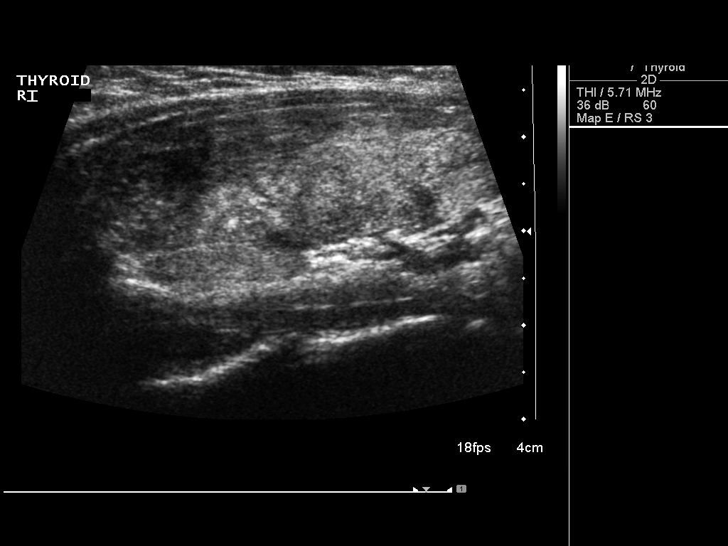
[im 10/18]
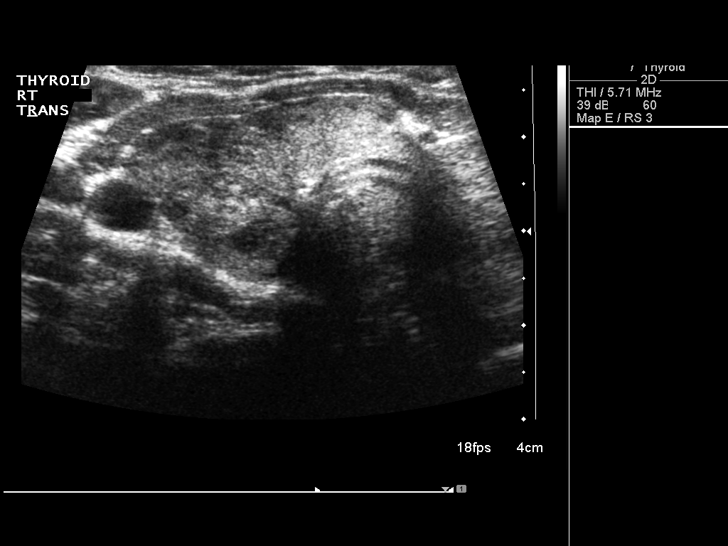
[im 11/18]
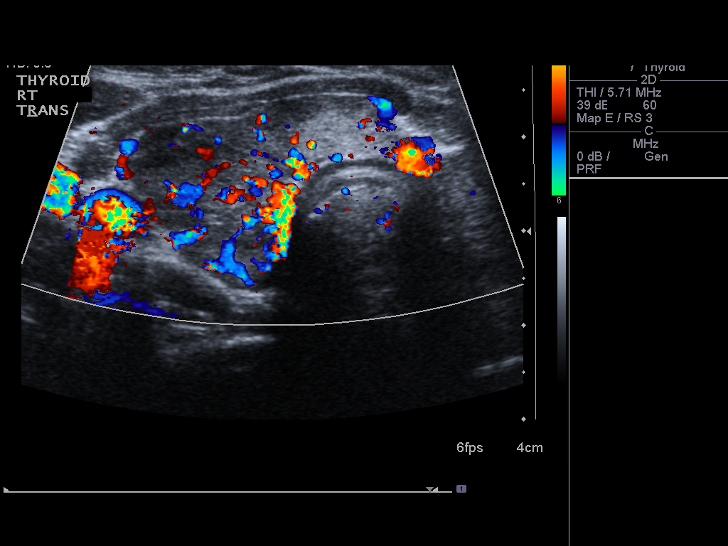
[im 12/18]
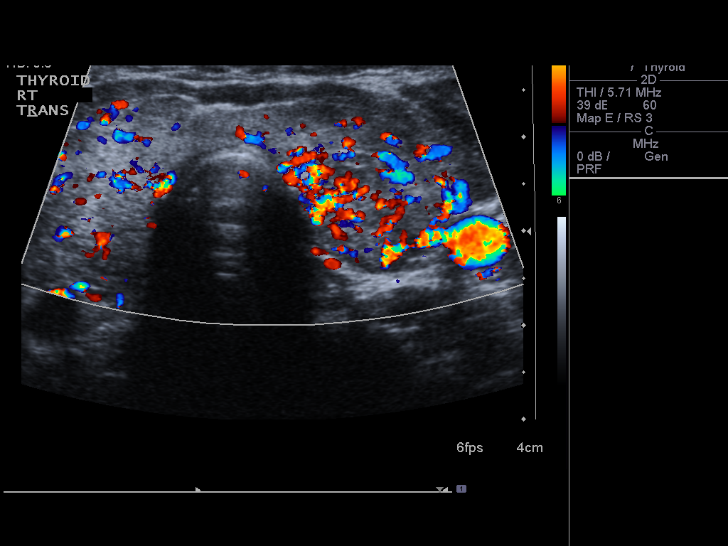
[im 14/18]
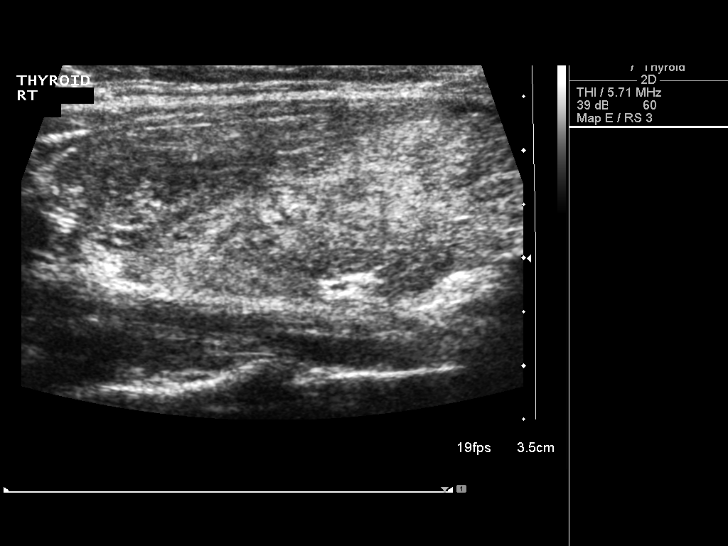
[im 15/18]
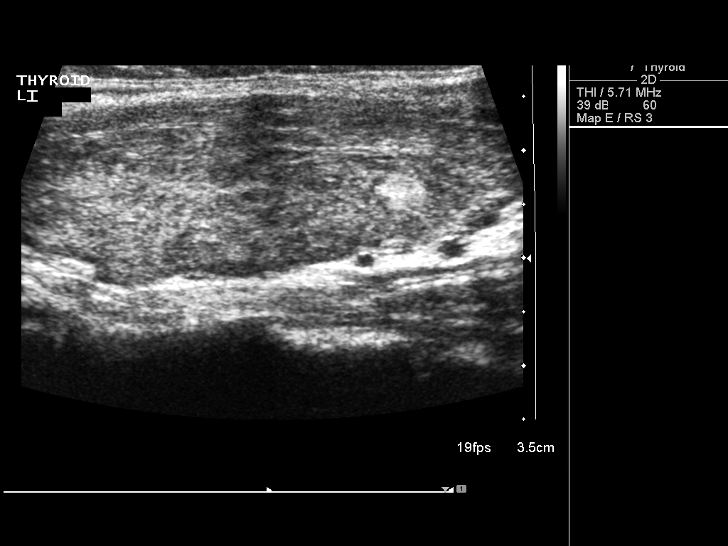
[im 16/18]
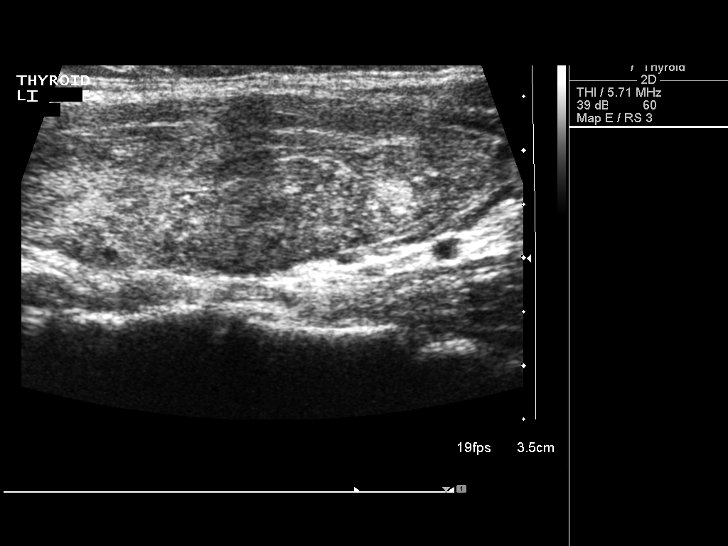
[im 18/18]
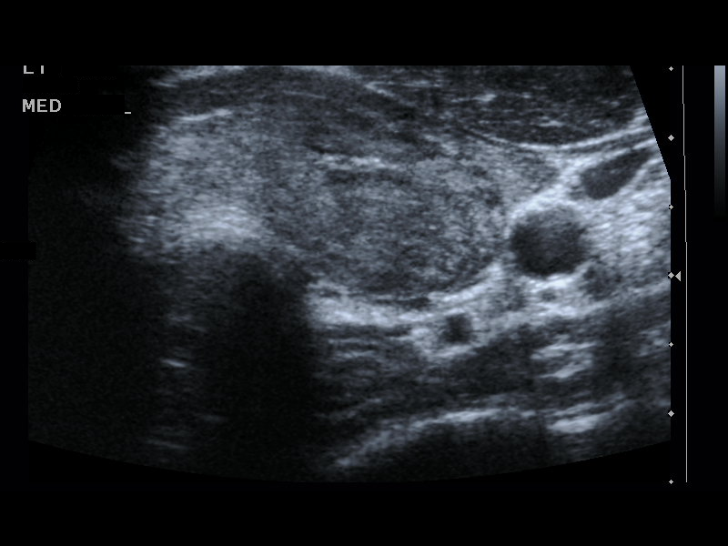

[13 of 18 positions shown; findings below may reference images not displayed]

FINDINGS: Right thyroid lobe

Heterogeneous right thyroid lobe, with pseudo nodular appearance. No
defined nodule. Diffusely increased vasculature within the right
thyroid lobe. Borderline enlarged.

Left thyroid lobe

Left thyroid lobe is heterogeneous, with pseudo nodule appearance in
the region of the previously described nodule. No defined nodule for
biopsy on today's study. Diffusely increased vasculature within the
left thyroid which is borderline enlarged.
IMPRESSION: Ultrasound survey before biopsy reveals pseudo nodular appearance of
the bilateral thyroid lobes. No defined nodule for biopsy.

There is diffusely increased blood flow within the thyroid tissue
which is borderline enlarged, compatible with a spectrum of
thyroiditis.

Pseudo nodule not biopsied on today's date. If patient has known
risk factors for thyroid carcinoma, consider follow-up ultrasound in
12 months.

Reference: Management of Thyroid Nodules Detected at US: Society of
Radiologists in Ultrasound Consensus Conference Statement. Radiology
9888; [DATE].

## 2016-11-06 ENCOUNTER — Telehealth: Payer: Self-pay | Admitting: Certified Nurse Midwife

## 2016-11-07 ENCOUNTER — Ambulatory Visit (INDEPENDENT_AMBULATORY_CARE_PROVIDER_SITE_OTHER): Payer: Self-pay | Admitting: Certified Nurse Midwife

## 2016-11-07 ENCOUNTER — Encounter: Payer: Self-pay | Admitting: Certified Nurse Midwife

## 2016-11-07 VITALS — BP 91/59 | HR 87 | Wt 164.0 lb

## 2016-11-07 DIAGNOSIS — N926 Irregular menstruation, unspecified: Secondary | ICD-10-CM

## 2016-11-07 LAB — POCT URINE PREGNANCY: Preg Test, Ur: POSITIVE — AB

## 2016-11-07 MED ORDER — DOXYLAMINE-PYRIDOXINE ER 20-20 MG PO TBCR: 1.0000 | EXTENDED_RELEASE_TABLET | Freq: Two times a day (BID) | ORAL | 4 refills | Status: DC

## 2016-11-07 NOTE — Patient Instructions (Signed)

## 2016-11-07 NOTE — Progress Notes (Signed)
Subjective:    Hailey Jones is a 26 y.o. female who presents for evaluation of amenorrhea. She believes she could be pregnant. Pregnancy is desired. Sexual Activity: single partner, contraception: none. Current symptoms also include: morning sickness, nausea and positive home pregnancy test. Last period was abnormal.   Patient's last menstrual period was 09/20/2016. The following portions of the patient's history were reviewed and updated as appropriate: allergies, current medications, past family history, past medical history, past social history, past surgical history and problem list.  Review of Systems Pertinent items are noted in HPI.     Objective:    BP (!) 91/59   Pulse 87   Wt 164 lb (74.4 kg)   LMP 09/20/2016   BMI 29.05 kg/m  General: alert, cooperative, appears stated age and no acute distress    Lab Review Urine HCG: positive    Assessment:    Absence of menstruation.     Plan:    Pregnancy Test: Positive: EDC unsure of dating due to irregular cycle. Briefly discussed pre-natal care options.Encouraged well-balanced diet, plenty of rest when needed, pre-natal vitamins daily and walking for exercise. Discussed self-help for nausea, avoiding OTC medications until consulting provider or pharmacist, other than Tylenol as needed, minimal caffeine (1-2 cups daily) and avoiding alcohol. Has history of hyper emesis with last pregnancy and is having N&V. Sample of Bonjesta given with instructions on use. Prescription to pharmacy.  U/S for dating asap. She will schedule her initial nurse visit @ 10 wks NOB physical visit @ 12 wks.  Feel free to call with any questions.  Doreene BurkeAnnie Swathi Jones, CNM

## 2016-11-08 ENCOUNTER — Other Ambulatory Visit: Payer: Self-pay

## 2016-11-09 ENCOUNTER — Other Ambulatory Visit: Payer: Self-pay

## 2016-11-13 ENCOUNTER — Ambulatory Visit (INDEPENDENT_AMBULATORY_CARE_PROVIDER_SITE_OTHER): Payer: Self-pay

## 2016-11-13 DIAGNOSIS — N926 Irregular menstruation, unspecified: Secondary | ICD-10-CM

## 2016-11-14 ENCOUNTER — Telehealth: Payer: Self-pay | Admitting: Certified Nurse Midwife

## 2016-11-14 NOTE — Telephone Encounter (Signed)
Pt aware [employer] will not cover bonjesta. Advised b6 25-50mg  tid and 12.5 unisom at bedtime. N/v protocol given. Pt advised to contact office if she is unable to hold liquids for greater than 3 hours.

## 2016-11-14 NOTE — Telephone Encounter (Signed)
Patient needs prior authorization for medication - she has new [employer] coverage  [facility] [address] Please call

## 2016-12-08 ENCOUNTER — Ambulatory Visit (INDEPENDENT_AMBULATORY_CARE_PROVIDER_SITE_OTHER): Payer: BLUE CROSS/BLUE SHIELD | Admitting: Certified Nurse Midwife

## 2016-12-08 VITALS — BP 112/83 | HR 93 | Ht 63.0 in | Wt 156.0 lb

## 2016-12-08 DIAGNOSIS — Z113 Encounter for screening for infections with a predominantly sexual mode of transmission: Secondary | ICD-10-CM

## 2016-12-08 DIAGNOSIS — Z1389 Encounter for screening for other disorder: Secondary | ICD-10-CM

## 2016-12-08 DIAGNOSIS — O30041 Twin pregnancy, dichorionic/diamniotic, first trimester: Secondary | ICD-10-CM

## 2016-12-08 NOTE — Progress Notes (Signed)
Hailey Jones presents for NOB nurse interview visit. Pregnancy confirmation done on 11/07/2016 by A. Janee Mornhompson, CNM. UPT: Positive. Ultrasound done on 11/13/2016 resulted in [name] at 6.2 wks.  G-2.  P-1001. Pregnancy education material explained and given. No cats in the home. NOB labs ordered.  HIV labs and Drug screen were explained optional and she did not decline. Drug screen ordered. PNV encouraged. Genetic screening options discussed. Genetic testing: Declined.  Pt may discuss with provider. Pt. To follow up with provider in 2  weeks for NOB physical.  All questions answered.

## 2016-12-08 NOTE — Patient Instructions (Signed)
Pregnancy and Zika Virus Disease Zika virus disease, or Zika, is an illness that can spread to people from mosquitoes that carry the virus. It may also spread from person to person through infected body fluids. Zika first occurred in [place], but recently it has spread to new areas. The virus occurs in tropical climates. The location of Zika continues to change. Most people who become infected with Zika virus do not develop serious illness. However, Zika may cause birth defects in an unborn baby whose mother is infected with the virus. It may also increase the risk of miscarriage. What are the symptoms of Zika virus disease? In many cases, people who have been infected with Zika virus do not develop any symptoms. If symptoms appear, they usually start about a week after the person is infected. Symptoms are usually mild. They may include:  Fever.  Rash.  Red eyes.  Joint pain.  How does Zika virus disease spread? The main way that Zika virus spreads is through the bite of a certain type of mosquito. Unlike most types of mosquitos, which bite only at night, the type of mosquito that carries Zika virus bites both at night and during the day. Zika virus can also spread through sexual contact, through a blood transfusion, and from a mother to her baby before or during birth. Once you have had Zika virus disease, it is unlikely that you will get it again. Can I pass Zika to my baby during pregnancy? Yes, Zika can pass from a mother to her baby before or during birth. What problems can Zika cause for my baby? A woman who is infected with Zika virus while pregnant is at risk of having her baby born with a condition in which the brain or head is smaller than expected (microcephaly). Babies who have microcephaly can have developmental delays, seizures, hearing problems, and vision problems. Having Zika virus disease during pregnancy can also increase the risk of miscarriage. How can Zika virus disease be  prevented? There is no vaccine to prevent Zika. The best way to prevent the disease is to avoid infected mosquitoes and avoid exposure to body fluids that can spread the virus. Avoid any possible exposure to Zika by taking the following precautions. For women and their sex partners:  Avoid traveling to high-risk areas. The locations where Zika is being reported change often. To identify high-risk areas, check the CDC travel website: www.cdc.gov/zika/geo/index.html  If you or your sex partner must travel to a high-risk area, talk with a health care provider before and after traveling.  Take all precautions to avoid mosquito bites if you live in, or travel to, any of the high-risk areas. Insect repellents are safe to use during pregnancy.  Ask your health care provider when it is safe to have sexual contact.  For women:  If you are pregnant or trying to become pregnant, avoid sexual contact with persons who may have been exposed to Zika virus, persons who have possible symptoms of Zika, or persons whose history you are unsure about. If you choose to have sexual contact with someone who may have been exposed to Zika virus, use condoms correctly during the entire duration of sexual activity, every time. Do not share sexual devices, as you may be exposed to body fluids.  Ask your health care provider about when it is safe to attempt pregnancy after a possible exposure to Zika virus.  What steps should I take to avoid mosquito bites? Take these steps to avoid mosquito bites   when you are in a high-risk area:  Wear loose clothing that covers your arms and legs.  Limit your outdoor activities.  Do not open windows unless they have window screens.  Sleep under mosquito nets.  Use insect repellent. The best insect repellents have:  DEET, picaridin, oil of lemon eucalyptus (OLE), or [id number] in them.  Higher amounts of an active ingredient in them.  Remember that insect repellents are safe to  use during pregnancy.  Do not use OLE on children who are younger than 6 years of age. Do not use insect repellent on babies who are younger than 46 months of age.  Cover your child's stroller with mosquito netting. Make sure the netting fits snugly and that any loose netting does not cover your child's mouth or nose. Do not use a blanket as a mosquito-protection cover.  Do not apply insect repellent underneath clothing.  If you are using sunscreen, apply the sunscreen before applying the insect repellent.  Treat clothing with permethrin. Do not apply permethrin directly to your skin. Follow label directions for safe use.  Get rid of standing water, where mosquitoes may reproduce. Standing water is often found in items such as buckets, bowls, animal food dishes, and flowerpots.  When you return from traveling to any high-risk area, continue taking actions to protect yourself against mosquito bites for 3 weeks, even if you show no signs of illness. This will prevent spreading Zika virus to uninfected mosquitoes. What should I know about the sexual transmission of Zika? People can spread Zika to their sexual partners during vaginal, anal, or oral sex, or by sharing sexual devices. Many people with [place] do not develop symptoms, so a person could spread the disease without knowing that they are infected. The greatest risk is to women who are pregnant or who may become pregnant. Zika virus can live longer in semen than it can live in blood. Couples can prevent sexual transmission of the virus by:  Using condoms correctly during the entire duration of sexual activity, every time. This includes vaginal, anal, and oral sex.  Not sharing sexual devices. Sharing increases your risk of being exposed to body fluid from another person.  Avoiding all sexual activity until your health care provider says it is safe.  Should I be tested for Zika virus? A sample of your blood can be tested for Zika virus. A  pregnant woman should be tested if she may have been exposed to the virus or if she has symptoms of Zika. She may also have additional tests done during her pregnancy, such ultrasound testing. Talk with your health care provider about which tests are recommended. This information is not intended to replace advice given to you by your health care provider. Make sure you discuss any questions you have with your health care provider. Document Released: 02/10/2015 Document Revised: 10/28/2015 Document Reviewed: 02/03/2015 Elsevier Interactive Patient Education  2018 [employer]. Hydroxyprogesterone solution for injection What is this medicine? HYDROXYPROGESTERONE (hye drox ee proe JES ter one) is a female hormone. This medicine is used in women who are pregnant and who have delivered a baby too early (preterm) in the past. It helps lower the risk of having a preterm baby again. This medicine may be used for other purposes; ask your health care provider or pharmacist if you have questions. COMMON BRAND NAME(S): Makena What should I tell my health care provider before I take this medicine? They need to know if you have any of these conditions: -  blood clotting disorders -breast, cervical, uterine, or vaginal cancer -depression -diabetes or prediabetes -heart disease -high blood pressure -kidney disease -liver disease -lung or breathing disease, like asthma -migraine headaches -seizures -vaginal bleeding -an unusual or allergic reaction to hydroxyprogesterone, other hormones, medicines, foods, dyes, castor oil, benzyl alcohol, or other preservatives -breast-feeding How should I use this medicine? This medicine is for injection into a muscle. It is given by a health care professional in a hospital or clinic setting. You are likely to get an injection once a week to prevent preterm delivery. Talk to your pediatrician regarding the use of this medicine in children. Special care may be  needed. Overdosage: If you think you have taken too much of this medicine contact a poison control center or emergency room at once. NOTE: This medicine is only for you. Do not share this medicine with others. What if I miss a dose? It is important not to miss your dose. Call your doctor or health care professional if you are unable to keep an appointment. What may interact with this medicine? -acetaminophen -bupropion -clozapine -efavirenz -halothane -methadone -nicotine -theophylline, aminophylline -tizanidine This list may not describe all possible interactions. Give your health care provider a list of all the medicines, herbs, non-prescription drugs, or dietary supplements you use. Also tell them if you smoke, drink alcohol, or use illegal drugs. Some items may interact with your medicine. What should I watch for while using this medicine? Your condition will be monitored carefully while you are receiving this medicine. What side effects may I notice from receiving this medicine? Side effects that you should report to your doctor or health care professional as soon as possible: -allergic reactions like skin rash, itching or hives, swelling of the face, lips, or tongue -breathing problems -breast tissue changes or discharge -changes in vision -confusion, trouble speaking or understanding -depressed mood -increased hunger or thirst -increased urination -pain, redness, or irritation at site where injected -pain, swelling, warmth in the leg -shortness of breath, chest pain, swelling in a leg -sudden numbness or weakness of the face, arm or leg -sudden severe headaches -trouble walking, dizziness, loss of balance or coordination -unusually weak or tired -vaginal bleeding -yellowing of the eyes or skin Side effects that usually do not require medical attention (report to your doctor or health care professional if they continue or are bothersome): -changes in emotions or  moods -diarrhea -fluid retention and swelling -nausea This list may not describe all possible side effects. Call your doctor for medical advice about side effects. You may report side effects to FDA at 1-800-FDA-1088. Where should I keep my medicine? This drug is given in a hospital or clinic and will not be stored at home. NOTE: This sheet is a summary. It may not cover all possible information. If you have questions about this medicine, talk to your doctor, pharmacist, or health care provider.  2018 Elsevier/Gold Standard (2009-07-13 11:17:12) Commonly Asked Questions During Pregnancy  Cats: A parasite can be excreted in cat feces.  To avoid exposure you need to have another person empty the little box.  If you must empty the litter box you will need to wear gloves.  Wash your hands after handling your cat.  This parasite can also be found in raw or undercooked meat so this should also be avoided.  Colds, Sore Throats, Flu: Please check your medication sheet to see what you can take for symptoms.  If your symptoms are unrelieved by these medications please call the  office.  Dental Work: Most any dental work Agricultural consultantyour dentist recommends is permitted.  X-rays should only be taken during the first trimester if absolutely necessary.  Your abdomen should be shielded with a lead apron during all x-rays.  Please notify your provider prior to receiving any x-rays.  Novocaine is fine; gas is not recommended.  If your dentist requires a note from us prior to dental work please call the office and we will provide one for you.  Exercise: Exercise is an important part of staying healthy during your pregnancy.  You may continue most exercises you were accustomed to prior to pregnancy.  Later in your pregnancy you will most likely notice you have difficulty with activities requiring balance like riding a bicycle.  It is important that you listen to your body and avoid activities that put you at a higher risk of  falling.  Adequate rest and staying well hydrated are a must!  If you have questions about the safety of specific activities ask your provider.    Exposure to Children with illness: Try to avoid obvious exposure; report any symptoms to us when noted,  If you have chicken pos, red measles or mumps, you should be immune to these diseases.   Please do not take any vaccines while pregnant unless you have checked with your OB provider.  Fetal Movement: After 28 weeks we recommend you do "kick counts" twice daily.  Lie or sit down in a calm quiet environment and count your baby movements "kicks".  You should feel your baby at least 10 times per hour.  If you have not felt 10 kicks within the first hour get up, walk around and have something sweet to eat or drink then repeat for an additional hour.  If count remains less than 10 per hour notify your provider.  Fumigating: Follow your pest control agent's advice as to how long to stay out of your home.  Ventilate the area well before re-entering.  Hemorrhoids:   Most over-the-counter preparations can be used during pregnancy.  Check your medication to see what is safe to use.  It is important to use a stool softener or fiber in your diet and to drink lots of liquids.  If hemorrhoids seem to be getting worse please call the office.   Hot Tubs:  Hot tubs Jacuzzis and saunas are not recommended while pregnant.  These increase your internal body temperature and should be avoided.  Intercourse:  Sexual intercourse is safe during pregnancy as long as you are comfortable, unless otherwise advised by your provider.  Spotting may occur after intercourse; report any bright red bleeding that is heavier than spotting.  Labor:  If you know that you are in labor, please go to the hospital.  If you are unsure, please call the office and let us help you decide what to do.  Lifting, straining, etc:  If your job requires heavy lifting or straining please check with your  provider for any limitations.  Generally, you should not lift items heavier than that you can lift simply with your hands and arms (no back muscles)  Painting:  Paint fumes do not harm your pregnancy, but may make you ill and should be avoided if possible.  Latex or water based paints have less odor than oils.  Use adequate ventilation while painting.  Permanents & Hair Color:  Chemicals in hair dyes are not recommended as they cause increase hair dryness which can increase hair loss during pregnancy.  "  Highlighting" and permanents are allowed.  Dye may be absorbed differently and permanents may not hold as well during pregnancy.  Sunbathing:  Use a sunscreen, as skin burns easily during pregnancy.  Drink plenty of fluids; avoid over heating.  Tanning Beds:  Because their possible side effects are still unknown, tanning beds are not recommended.  Ultrasound Scans:  Routine ultrasounds are performed at approximately 20 weeks.  You will be able to see your baby's general anatomy an if you would like to know the gender this can usually be determined as well.  If it is questionable when you conceived you may also receive an ultrasound early in your pregnancy for dating purposes.  Otherwise ultrasound exams are not routinely performed unless there is a medical necessity.  Although you can request a scan we ask that you pay for it when conducted because insurance does not cover " patient request" scans.  Work: If your pregnancy proceeds without complications you may work until your due date, unless your physician or employer advises otherwise.  Round Ligament Pain/Pelvic Discomfort:  Sharp, shooting pains not associated with bleeding are fairly common, usually occurring in the second trimester of pregnancy.  They tend to be worse when standing up or when you remain standing for long periods of time.  These are the result of pressure of certain pelvic ligaments called "round ligaments".  Rest, Tylenol and  heat seem to be the most effective relief.  As the womb and fetus grow, they rise out of the pelvis and the discomfort improves.  Please notify the office if your pain seems different than that described.  It may represent a more serious condition.

## 2016-12-09 LAB — CBC WITH DIFFERENTIAL/PLATELET
Basophils Absolute: 0 10*3/uL (ref 0.0–0.2)
Basos: 0 %
EOS (ABSOLUTE): 0.1 10*3/uL (ref 0.0–0.4)
EOS: 1 %
HEMATOCRIT: 39.8 % (ref 34.0–46.6)
HEMOGLOBIN: 13.9 g/dL (ref 11.1–15.9)
Immature Grans (Abs): 0 10*3/uL (ref 0.0–0.1)
Immature Granulocytes: 0 %
Lymphocytes Absolute: 1.8 10*3/uL (ref 0.7–3.1)
Lymphs: 25 %
MCH: 30.8 pg (ref 26.6–33.0)
MCHC: 34.9 g/dL (ref 31.5–35.7)
MCV: 88 fL (ref 79–97)
MONOCYTES: 7 %
Monocytes Absolute: 0.5 10*3/uL (ref 0.1–0.9)
NEUTROS PCT: 67 %
Neutrophils Absolute: 5 10*3/uL (ref 1.4–7.0)
Platelets: 272 10*3/uL (ref 150–379)
RBC: 4.52 x10E6/uL (ref 3.77–5.28)
RDW: 14.3 % (ref 12.3–15.4)
WBC: 7.4 10*3/uL (ref 3.4–10.8)

## 2016-12-09 LAB — HEPATITIS B SURFACE ANTIGEN: HEP B S AG: NEGATIVE

## 2016-12-09 LAB — ABO

## 2016-12-09 LAB — HIV ANTIBODY (ROUTINE TESTING W REFLEX): HIV Screen 4th Generation wRfx: NONREACTIVE

## 2016-12-09 LAB — VARICELLA ZOSTER ANTIBODY, IGG: Varicella zoster IgG: >4000 {index} (ref >165)

## 2016-12-09 LAB — SYPHILIS: RPR W/REFLEX TO RPR TITER AND TREPONEMAL ANTIBODIES, TRADITIONAL SCREENING AND DIAGNOSIS ALGORITHM: RPR Ser Ql: NONREACTIVE

## 2016-12-09 LAB — ANTIBODY SCREEN: Antibody Screen: NEGATIVE

## 2016-12-09 LAB — RH TYPE: Rh Factor: NEGATIVE

## 2016-12-09 LAB — RUBELLA SCREEN: Rubella Antibodies, IGG: 5.15 {index} (ref >0.99)

## 2016-12-10 LAB — GC/CHLAMYDIA PROBE AMP
CHLAMYDIA, DNA PROBE: NEGATIVE
Neisseria gonorrhoeae by PCR: NEGATIVE

## 2016-12-10 LAB — CULTURE, OB URINE

## 2016-12-10 LAB — URINE CULTURE, OB REFLEX

## 2016-12-11 LAB — URINALYSIS, ROUTINE W REFLEX MICROSCOPIC
Bilirubin, UA: NEGATIVE
Glucose, UA: NEGATIVE
LEUKOCYTES UA: NEGATIVE
Nitrite, UA: NEGATIVE
RBC, UA: NEGATIVE
Urobilinogen, Ur: 1 mg/dL (ref 0.2–1.0)
pH, UA: 6.5 (ref 5.0–7.5)

## 2016-12-11 LAB — MONITOR DRUG PROFILE 14(MW)
Amphetamine Scrn, Ur: NEGATIVE ng/mL
BARBITURATE SCREEN URINE: NEGATIVE ng/mL
BENZODIAZEPINE SCREEN, URINE: NEGATIVE ng/mL
BUPRENORPHINE, URINE: NEGATIVE ng/mL
CANNABINOIDS UR QL SCN: NEGATIVE ng/mL
COCAINE(METAB.)SCREEN, URINE: NEGATIVE ng/mL
CREATININE(CRT), U: 335.3 mg/dL — AB (ref 20.0–300.0)
Fentanyl, Urine: NEGATIVE pg/mL
METHADONE SCREEN, URINE: NEGATIVE ng/mL
Meperidine Screen, Urine: NEGATIVE ng/mL
OXYCODONE+OXYMORPHONE UR QL SCN: NEGATIVE ng/mL
Opiate Scrn, Ur: NEGATIVE ng/mL
PH UR, DRUG SCRN: 6.4 (ref 4.5–8.9)
PHENCYCLIDINE QUANTITATIVE URINE: NEGATIVE ng/mL
Propoxyphene Scrn, Ur: NEGATIVE ng/mL
SPECIFIC GRAVITY: 1.021
Tramadol Screen, Urine: NEGATIVE ng/mL

## 2016-12-11 LAB — NICOTINE SCREEN, URINE: Cotinine Ql Scrn, Ur: NEGATIVE ng/mL

## 2016-12-20 ENCOUNTER — Ambulatory Visit (INDEPENDENT_AMBULATORY_CARE_PROVIDER_SITE_OTHER): Payer: BLUE CROSS/BLUE SHIELD | Admitting: Certified Nurse Midwife

## 2016-12-20 ENCOUNTER — Encounter: Payer: Self-pay | Admitting: Certified Nurse Midwife

## 2016-12-20 VITALS — BP 107/73 | HR 113 | Wt 157.3 lb

## 2016-12-20 DIAGNOSIS — Z3481 Encounter for supervision of other normal pregnancy, first trimester: Secondary | ICD-10-CM

## 2016-12-20 DIAGNOSIS — N888 Other specified noninflammatory disorders of cervix uteri: Secondary | ICD-10-CM

## 2016-12-20 DIAGNOSIS — R8299 Other abnormal findings in urine: Secondary | ICD-10-CM

## 2016-12-20 DIAGNOSIS — R82998 Other abnormal findings in urine: Secondary | ICD-10-CM

## 2016-12-20 LAB — POCT URINALYSIS DIPSTICK
BILIRUBIN UA: NEGATIVE
GLUCOSE UA: NEGATIVE
Ketones, UA: NEGATIVE
NITRITE UA: NEGATIVE
Protein, UA: NEGATIVE
Spec Grav, UA: 1.02 (ref 1.010–1.025)
Urobilinogen, UA: 0.2 E.U./dL
pH, UA: 6 (ref 5.0–8.0)

## 2016-12-20 MED ORDER — PROMETHAZINE HCL 12.5 MG PO TABS: 12.5000 mg | ORAL_TABLET | Freq: Four times a day (QID) | ORAL | 0 refills | Status: DC | PRN

## 2016-12-20 NOTE — Patient Instructions (Signed)
Multiple Pregnancy Having a multiple pregnancy means that a woman is carrying more than one baby at a time. She may be pregnant with twins, triplets, or more. The majority of multiple pregnancies are twins. Naturally conceiving triplets or more (higher-order multiples) is rare. Multiple pregnancies are riskier than single pregnancies. A woman with a multiple pregnancy is more likely to have certain problems during her pregnancy. Therefore, she will need to have more frequent appointments for prenatal care. How does a multiple pregnancy happen? A multiple pregnancy happens when:  The woman's body releases more than one egg at a time, and then each egg gets fertilized by a different sperm. ? This is the most common type of multiple pregnancy. ? Twins or other multiples produced this way are fraternal. They are no more alike than non-multiple siblings are.  One sperm fertilizes one egg, which then divides into more than one embryo. ? Twins or other multiples produced this way are identical. Identical multiples are always the same gender, and they look very much alike.  Who is most likely to have a multiple pregnancy? A multiple pregnancy is more likely to develop in women who:  Have had fertility treatment, especially if the treatment included fertility drugs.  Are older than 26 years of age.  Have already had four or more children.  Have a family history of multiple pregnancy.  How is a multiple pregnancy diagnosed? A multiple pregnancy may be diagnosed based on:  Symptoms such as: ? Rapid weight gain in the first 3 months of pregnancy (first trimester). ? More severe nausea and breast tenderness than what is typical of a single pregnancy. ? The uterus measuring larger than what is normal for the stage of the pregnancy.  Blood tests that detect a higher-than-normal level of human chorionic gonadotropin (hCG). This is a hormone that your body produces in early pregnancy.  Ultrasound  exam. This is used to confirm that you are carrying multiples.  What risks are associated with multiple pregnancy? A multiple pregnancy puts you at a higher risk for certain problems during or after your pregnancy, including:  Having your babies delivered before you have reached a full-term pregnancy (preterm birth). A full-term pregnancy lasts for at least 37 weeks. Babies born before 37 weeks may have a higher risk of a variety of health problems, such as breathing problems, feeding difficulties, cerebral palsy, and learning disabilities.  Diabetes.  Preeclampsia. This is a serious condition that causes high blood pressure along with other symptoms, such as swelling and headaches, during pregnancy.  Excessive blood loss after childbirth (postpartum hemorrhage).  Postpartum depression.  Low birth weight of the babies.  How will having a multiple pregnancy affect my care? Your health care provider will want to monitor you more closely during your pregnancy to make sure that your babies are growing normally and that you are healthy. Follow these instructions at home: Because your pregnancy is considered to be high risk, you will need to work closely with your health care team. You may also need to make some lifestyle changes. These may include the following: Eating and drinking  Increase your nutrition. ? Follow your health care provider's recommendations for weight gain. You may need to gain a little extra weight when you are pregnant with multiples. ? Eat healthy snacks often throughout the day. This can add calories and reduce nausea.  Drink enough fluid to keep your urine clear or pale yellow.  Take prenatal vitamins. Activity By 20-24 weeks, you may   need to limit your activities.  Avoid activities and work that take a lot of effort (are strenuous).  Ask your health care provider when you should stop having sexual intercourse.  Rest often.  General instructions  Do not use  any products that contain nicotine or tobacco, such as cigarettes and e-cigarettes. If you need help quitting, ask your health care provider.  Do not drink alcohol or use illegal drugs.  Take over-the-counter and prescription medicines only as told by your health care provider.  Arrange for extra help around the house.  Keep all follow-up visits and all prenatal visits as told by your health care provider. This is important. Contact a health care provider if:  You have dizziness.  You have persistent nausea, vomiting, or diarrhea.  You are having trouble gaining weight.  You have feelings of depression or other emotions that are interfering with your normal activities. Get help right away if:  You have a fever.  You have pain with urination.  You have fluid leaking from your vagina.  You have a bad-smelling vaginal discharge.  You notice increased swelling in your face, hands, legs, or ankles.  You have spotting or bleeding from your vagina.  You have pelvic cramps, pelvic pressure, or nagging pain in your abdomen or lower back.  You are having regular contractions.  You develop a severe headache, with or without visual changes.  You have shortness of breath or chest pain.  You notice less fetal movement, or no fetal movement. This information is not intended to replace advice given to you by your health care provider. Make sure you discuss any questions you have with your health care provider. Document Released: 02/29/2008 Document Revised: 01/21/2016 Document Reviewed: 01/21/2016 Elsevier Interactive Patient Education  2018 [employer]   How a Baby Grows During Pregnancy Pregnancy begins when a female's sperm enters a female's egg (fertilization). This happens in one of the tubes (fallopian tubes) that connect the ovaries to the womb (uterus). The fertilized egg is called an embryo until it reaches 10 weeks. From 10 weeks until birth, it is called a fetus. The  fertilized egg moves down the fallopian tube to the uterus. Then it implants into the lining of the uterus and begins to grow. The developing fetus receives oxygen and nutrients through the pregnant woman's bloodstream and the tissues that grow (placenta) to support the fetus. The placenta is the life support system for the fetus. It provides nutrition and removes waste. Learning as much as you can about your pregnancy and how your baby is developing can help you enjoy the experience. It can also make you aware of when there might be a problem and when to ask questions. How long does a typical pregnancy last? A pregnancy usually lasts 280 days, or about 40 weeks. Pregnancy is divided into three trimesters:  First trimester: 0-13 weeks.  Second trimester: 14-27 weeks.  Third trimester: 28-40 weeks.  The day when your baby is considered ready to be born (full term) is your estimated date of delivery. How does my baby develop month by month? First month  The fertilized egg attaches to the inside of the uterus.  Some cells will form the placenta. Others will form the fetus.  The arms, legs, brain, spinal cord, lungs, and heart begin to develop.  At the end of the first month, the heart begins to beat.  Second month  The bones, inner ear, eyelids, hands, and feet form.  The genitals develop.    By the end of 8 weeks, all major organs are developing.  Third month  All of the internal organs are forming.  Teeth develop below the gums.  Bones and muscles begin to grow. The spine can flex.  The skin is transparent.  Fingernails and toenails begin to form.  Arms and legs continue to grow longer, and hands and feet develop.  The fetus is about 3 in (7.6 cm) long.  Fourth month  The placenta is completely formed.  The external sex organs, neck, outer ear, eyebrows, eyelids, and fingernails are formed.  The fetus can hear, swallow, and move its arms and legs.  The kidneys  begin to produce urine.  The skin is covered with a white waxy coating (vernix) and very fine hair (lanugo).  Fifth month  The fetus moves around more and can be felt for the first time (quickening).  The fetus starts to sleep and wake up and may begin to suck its finger.  The nails grow to the end of the fingers.  The organ in the digestive system that makes bile (gallbladder) functions and helps to digest the nutrients.  If your baby is a girl, eggs are present in her ovaries. If your baby is a boy, testicles start to move down into his scrotum.  Sixth month  The lungs are formed, but the fetus is not yet able to breathe.  The eyes open. The brain continues to develop.  Your baby has fingerprints and toe prints. Your baby's hair grows thicker.  At the end of the second trimester, the fetus is about 9 in (22.9 cm) long.  Seventh month  The fetus kicks and stretches.  The eyes are developed enough to sense changes in light.  The hands can make a grasping motion.  The fetus responds to sound.  Eighth month  All organs and body systems are fully developed and functioning.  Bones harden and taste buds develop. The fetus may hiccup.  Certain areas of the brain are still developing. The skull remains soft.  Ninth month  The fetus gains about  lb (0.23 kg) each week.  The lungs are fully developed.  Patterns of sleep develop.  The fetus's head typically moves into a head-down position (vertex) in the uterus to prepare for birth. If the buttocks move into a vertex position instead, the baby is breech.  The fetus weighs 6-9 lbs (2.72-4.08 kg) and is 19-20 in (48.26-50.8 cm) long.  What can I do to have a healthy pregnancy and help my baby develop? Eating and Drinking  Eat a healthy diet. ? Talk with your health care provider to make sure that you are getting the nutrients that you and your baby need. ? Visit www.choosemyplate.gov to learn about creating a healthy  diet.  Gain a healthy amount of weight during pregnancy as advised by your health care provider. This is usually 25-35 pounds. You may need to: ? Gain more if you were underweight before getting pregnant or if you are pregnant with more than one baby. ? Gain less if you were overweight or obese when you got pregnant.  Medicines and Vitamins  Take prenatal vitamins as directed by your health care provider. These include vitamins such as folic acid, iron, calcium, and vitamin D. They are important for healthy development.  Take medicines only as directed by your health care provider. Read labels and ask a pharmacist or your health care provider whether over-the-counter medicines, supplements, and prescription drugs are safe to   take during pregnancy.  Activities  Be physically active as advised by your health care provider. Ask your health care provider to recommend activities that are safe for you to do, such as walking or swimming.  Do not participate in strenuous or extreme sports.  Lifestyle  Do not drink alcohol.  Do not use any tobacco products, including cigarettes, chewing tobacco, or electronic cigarettes. If you need help quitting, ask your health care provider.  Do not use illegal drugs.  Safety  Avoid exposure to mercury, lead, or other heavy metals. Ask your health care provider about common sources of these heavy metals.  Avoid listeria infection during pregnancy. Follow these precautions: ? Do not eat soft cheeses or deli meats. ? Do not eat hot dogs unless they have been warmed up to the point of steaming, such as in the microwave oven. ? Do not drink unpasteurized milk.  Avoid toxoplasmosis infection during pregnancy. Follow these precautions: ? Do not change your cat's litter box, if you have a cat. Ask someone else to do this for you. ? Wear gardening gloves while working in the yard.  General Instructions  Keep all follow-up visits as directed by your health  care provider. This is important. This includes prenatal care and screening tests.  Manage any chronic health conditions. Work closely with your health care provider to keep conditions, such as diabetes, under control.  How do I know if my baby is developing well? At each prenatal visit, your health care provider will do several different tests to check on your health and keep track of your baby's development. These include:  Fundal height. ? Your health care provider will measure your growing belly from top to bottom using a tape measure. ? Your health care provider will also feel your belly to determine your baby's position.  Heartbeat. ? An ultrasound in the first trimester can confirm pregnancy and show a heartbeat, depending on how far along you are. ? Your health care provider will check your baby's heart rate at every prenatal visit. ? As you get closer to your delivery date, you may have regular fetal heart rate monitoring to make sure that your baby is not in distress.  Second trimester ultrasound. ? This ultrasound checks your baby's development. It also indicates your baby's gender.  What should I do if I have concerns about my baby's development? Always talk with your health care provider about any concerns that you may have. This information is not intended to replace advice given to you by your health care provider. Make sure you discuss any questions you have with your health care provider. Document Released: 11/08/2007 Document Revised: 10/28/2015 Document Reviewed: 10/29/2013 Elsevier Interactive Patient Education  2018 [employer] Inc.  

## 2016-12-20 NOTE — Addendum Note (Signed)
Addended by: Mechele ClaudeHOMPSON, Clydia Nieves M on: 12/20/2016 05:54 PM   Modules accepted: Orders

## 2016-12-20 NOTE — Progress Notes (Addendum)
NEW OB HISTORY AND PHYSICAL  SUBJECTIVE:       Hailey Jones is a 26 y.o. [id number] female, Patient's last menstrual period was 09/20/2016 (exact date)., Estimated Date of Delivery: 07/07/17, [id number], presents today for establishment of Prenatal Care. She complains of nausea that is not relieved by [place]. Discussed options. Phenergan ordered.  Gynecologic History Patient's last menstrual period was 09/20/2016 (exact date). Normal Contraception: none Last Pap: 2016. Results were: normal  Obstetric History OB History  Gravida Para Term Preterm AB Living  2 1 1  0 0 1  SAB TAB Ectopic Multiple Live Births  0 0 0 0 1    # Outcome Date GA Lbr Len/2nd Weight Sex Delivery Anes PTL Lv  2 Current           1 Term 04/22/15 [id number]  8 lb 13.5 oz (4.01 kg) M CS-LTranv Spinal  LIV     Birth Comments: hypospadius       Past Medical History:  Diagnosis Date  . Medical history non-contributory   . Single delivery by C-section 04/22/2015    Past Surgical History:  Procedure Laterality Date  . CESAREAN SECTION N/A 04/22/2015   Procedure: CESAREAN SECTION;  Surgeon: [place] Bingharlie Pickens, MD;  Location: [facility] ORS;  Service: Obstetrics;  Laterality: N/A;  . NO PAST SURGERIES    . WISDOM TOOTH EXTRACTION  2011    Current Outpatient Prescriptions on File Prior to Visit  Medication Sig Dispense Refill  . Doxylamine-Pyridoxine ER (BONJESTA) 20-20 MG TBCR Take 1 capsule by mouth 2 (two) times daily. 62 tablet 4  . Prenatal Vit-Fe Fumarate-FA (PRENATAL MULTIVITAMIN) TABS tablet Take 1 tablet by mouth daily at 12 noon.     No current facility-administered medications on file prior to visit.     No Known Allergies  Social History   Social History  . Marital status: Married    Spouse name: N/A  . Number of children: 0  . Years of education: N/A   Occupational History  . Not on file.   Social History Main Topics  . Smoking status: Never Smoker  . Smokeless tobacco: Never Used  . Alcohol use No  .  Drug use: No  . Sexual activity: Yes   Other Topics Concern  . Not on file   Social History Narrative  . No narrative on file    Family History  Problem Relation Age of Onset  . Heart disease Paternal Grandfather   . Hypertension Father   . Hypertension Maternal Grandmother   . Heart disease Maternal Grandfather   . Hypertension Paternal Grandmother   . Heart disease Paternal Grandmother     The following portions of the patient's history were reviewed and updated as appropriate: allergies, current medications, past OB history, past medical history, past surgical history, past family history, past social history, and problem list.    OBJECTIVE: Initial Physical Exam (New OB)  GENERAL APPEARANCE: alert, well appearing, in no apparent distress, oriented to person, place and time HEAD: normocephalic, atraumatic MOUTH: mucous membranes moist, pharynx normal without lesions THYROID: no thyromegaly or masses present BREASTS: no masses noted, no significant tenderness, no palpable axillary nodes, no skin changes LUNGS: clear to auscultation, no wheezes, rales or rhonchi, symmetric air entry HEART: regular rate and rhythm, no murmurs ABDOMEN: soft, nontender, nondistended, no abnormal masses, no epigastric pain EXTREMITIES: no redness or tenderness in the calves or thighs SKIN: normal coloration and turgor, no rashes LYMPH NODES: no adenopathy palpable NEUROLOGIC: alert, oriented, normal speech,  no focal findings or movement disorder noted  PELVIC EXAM EXTERNAL GENITALIA: normal appearing vulva with no masses, tenderness or lesions VAGINA: no abnormal discharge or lesions CERVIX: no lesions or cervical motion tenderness and bloody discharge with contact of speculum and pap smear UTERUS: gravid ADNEXA: no masses palpable and nontender OB EXAM PELVIMETRY: appears adequate RECTUM: exam not indicated  ASSESSMENT: Normal  Twin pregnancy  PLAN: Prenatal careNew OB  counseling: The patient has been given an overview regarding routine prenatal care. Recommendations regarding diet, weight gain, and exercise in pregnancy were given. Prenatal testing, optional genetic testing, and ultrasound use in pregnancy were reviewed. Benefits of Breast Feeding were discussed. The patient is encouraged to consider nursing her baby post partum. She is undecided if she will try to [facility] with twins.  See orders  Doreene Burke, CNM

## 2016-12-22 LAB — URINE CULTURE, OB REFLEX

## 2016-12-22 LAB — CULTURE, OB URINE

## 2016-12-26 LAB — PAP IG W/ RFLX HPV ASCU: PAP SMEAR COMMENT: 0

## 2016-12-27 ENCOUNTER — Encounter: Payer: Self-pay | Admitting: Certified Nurse Midwife

## 2016-12-28 LAB — NUSWAB VAGINITIS PLUS (VG+)
Candida albicans, NAA: NEGATIVE
Candida glabrata, NAA: NEGATIVE
Chlamydia trachomatis, NAA: NEGATIVE
Neisseria gonorrhoeae, NAA: NEGATIVE
Trich vag by NAA: NEGATIVE

## 2016-12-29 ENCOUNTER — Encounter: Payer: Self-pay | Admitting: Certified Nurse Midwife

## 2017-01-18 ENCOUNTER — Ambulatory Visit (INDEPENDENT_AMBULATORY_CARE_PROVIDER_SITE_OTHER): Payer: BLUE CROSS/BLUE SHIELD | Admitting: Certified Nurse Midwife

## 2017-01-18 ENCOUNTER — Encounter: Payer: Self-pay | Admitting: Certified Nurse Midwife

## 2017-01-18 VITALS — BP 106/73 | HR 95 | Wt 157.1 lb

## 2017-01-18 DIAGNOSIS — Z3482 Encounter for supervision of other normal pregnancy, second trimester: Secondary | ICD-10-CM

## 2017-01-18 LAB — POCT URINALYSIS DIPSTICK
BILIRUBIN UA: NEGATIVE
GLUCOSE UA: NEGATIVE
Ketones, UA: NEGATIVE
Leukocytes, UA: NEGATIVE
Nitrite, UA: NEGATIVE
PH UA: 7 (ref 5.0–8.0)
Protein, UA: NEGATIVE
SPEC GRAV UA: 1.02 (ref 1.010–1.025)
Urobilinogen, UA: 0.2 E.U./dL

## 2017-01-18 NOTE — Progress Notes (Signed)
ROB-Pt doing well, reports intermittent vomiting without nausea. Discussed home treatment measures including GERD management techniques and OTC zantac. Declines Zofran Rx. Reviewed red flag symptoms and when to call. RTC x 4-5 weeks for anatomy scan and ROB.

## 2017-01-18 NOTE — Patient Instructions (Addendum)
Multiple Pregnancy Having a multiple pregnancy means that a woman is carrying more than one baby at a time. She may be pregnant with twins, triplets, or more. The majority of multiple pregnancies are twins. Naturally conceiving triplets or more (higher-order multiples) is rare. Multiple pregnancies are riskier than single pregnancies. A woman with a multiple pregnancy is more likely to have certain problems during her pregnancy. Therefore, she will need to have more frequent appointments for prenatal care. How does a multiple pregnancy happen? A multiple pregnancy happens when:  The woman's body releases more than one egg at a time, and then each egg gets fertilized by a different sperm. ? This is the most common type of multiple pregnancy. ? Twins or other multiples produced this way are fraternal. They are no more alike than non-multiple siblings are.  One sperm fertilizes one egg, which then divides into more than one embryo. ? Twins or other multiples produced this way are identical. Identical multiples are always the same gender, and they look very much alike.  Who is most likely to have a multiple pregnancy? A multiple pregnancy is more likely to develop in women who:  Have had fertility treatment, especially if the treatment included fertility drugs.  Are older than 26 years of age.  Have already had four or more children.  Have a family history of multiple pregnancy.  How is a multiple pregnancy diagnosed? A multiple pregnancy may be diagnosed based on:  Symptoms such as: ? Rapid weight gain in the first 3 months of pregnancy (first trimester). ? More severe nausea and breast tenderness than what is typical of a single pregnancy. ? The uterus measuring larger than what is normal for the stage of the pregnancy.  Blood tests that detect a higher-than-normal level of human chorionic gonadotropin (hCG). This is a hormone that your body produces in early pregnancy.  Ultrasound  exam. This is used to confirm that you are carrying multiples.  What risks are associated with multiple pregnancy? A multiple pregnancy puts you at a higher risk for certain problems during or after your pregnancy, including:  Having your babies delivered before you have reached a full-term pregnancy (preterm birth). A full-term pregnancy lasts for at least 37 weeks. Babies born before 53 weeks may have a higher risk of a variety of health problems, such as breathing problems, feeding difficulties, cerebral palsy, and learning disabilities.  Diabetes.  Preeclampsia. This is a serious condition that causes high blood pressure along with other symptoms, such as swelling and headaches, during pregnancy.  Excessive blood loss after childbirth (postpartum hemorrhage).  Postpartum depression.  Low birth weight of the babies.  How will having a multiple pregnancy affect my care? Your health care provider will want to monitor you more closely during your pregnancy to make sure that your babies are growing normally and that you are healthy. Follow these instructions at home: Because your pregnancy is considered to be high risk, you will need to work closely with your health care team. You may also need to make some lifestyle changes. These may include the following: Eating and drinking  Increase your nutrition. ? Follow your health care provider's recommendations for weight gain. You may need to gain a little extra weight when you are pregnant with multiples. ? Eat healthy snacks often throughout the day. This can add calories and reduce nausea.  Drink enough fluid to keep your urine clear or pale yellow.  Take prenatal vitamins. Activity By 20-24 weeks, you may  need to limit your activities.  Avoid activities and work that take a lot of effort (are strenuous).  Ask your health care provider when you should stop having sexual intercourse.  Rest often.  General instructions  Do not use  any products that contain nicotine or tobacco, such as cigarettes and e-cigarettes. If you need help quitting, ask your health care provider.  Do not drink alcohol or use illegal drugs.  Take over-the-counter and prescription medicines only as told by your health care provider.  Arrange for extra help around the house.  Keep all follow-up visits and all prenatal visits as told by your health care provider. This is important. Contact a health care provider if:  You have dizziness.  You have persistent nausea, vomiting, or diarrhea.  You are having trouble gaining weight.  You have feelings of depression or other emotions that are interfering with your normal activities. Get help right away if:  You have a fever.  You have pain with urination.  You have fluid leaking from your vagina.  You have a bad-smelling vaginal discharge.  You notice increased swelling in your face, hands, legs, or ankles.  You have spotting or bleeding from your vagina.  You have pelvic cramps, pelvic pressure, or nagging pain in your abdomen or lower back.  You are having regular contractions.  You develop a severe headache, with or without visual changes.  You have shortness of breath or chest pain.  You notice less fetal movement, or no fetal movement. This information is not intended to replace advice given to you by your health care provider. Make sure you discuss any questions you have with your health care provider. Document Released: 02/29/2008 Document Revised: 01/21/2016 Document Reviewed: 01/21/2016 Elsevier Interactive Patient Education  2018 Standard of Pregnancy The second trimester is from week 14 through week 27 (months 4 through 6). The second trimester is often a time when you feel your best. Your body has adjusted to being pregnant, and you begin to feel better physically. Usually, morning sickness has lessened or quit completely, you may have more energy,  and you may have an increase in appetite. The second trimester is also a time when the fetus is growing rapidly. At the end of the sixth month, the fetus is about 9 inches long and weighs about 1 pounds. You will likely begin to feel the baby move (quickening) between 16 and 20 weeks of pregnancy. Body changes during your second trimester Your body continues to go through many changes during your second trimester. The changes vary from woman to woman.  Your weight will continue to increase. You will notice your lower abdomen bulging out.  You may begin to get stretch marks on your hips, abdomen, and breasts.  You may develop headaches that can be relieved by medicines. The medicines should be approved by your health care provider.  You may urinate more often because the fetus is pressing on your bladder.  You may develop or continue to have heartburn as a result of your pregnancy.  You may develop constipation because certain hormones are causing the muscles that push waste through your intestines to slow down.  You may develop hemorrhoids or swollen, bulging veins (varicose veins).  You may have back pain. This is caused by: ? Weight gain. ? Pregnancy hormones that are relaxing the joints in your pelvis. ? A shift in weight and the muscles that support your balance.  Your breasts will continue to grow and  they will continue to become tender.  Your gums may bleed and may be sensitive to brushing and flossing.  Dark spots or blotches (chloasma, mask of pregnancy) may develop on your face. This will likely fade after the baby is born.  A dark line from your belly button to the pubic area (linea nigra) may appear. This will likely fade after the baby is born.  You may have changes in your hair. These can include thickening of your hair, rapid growth, and changes in texture. Some women also have hair loss during or after pregnancy, or hair that feels dry or thin. Your hair will most  likely return to normal after your baby is born.  What to expect at prenatal visits During a routine prenatal visit:  You will be weighed to make sure you and the fetus are growing normally.  Your blood pressure will be taken.  Your abdomen will be measured to track your baby's growth.  The fetal heartbeat will be listened to.  Any test results from the previous visit will be discussed.  Your health care provider may ask you:  How you are feeling.  If you are feeling the baby move.  If you have had any abnormal symptoms, such as leaking fluid, bleeding, severe headaches, or abdominal cramping.  If you are using any tobacco products, including cigarettes, chewing tobacco, and electronic cigarettes.  If you have any questions.  Other tests that may be performed during your second trimester include:  Blood tests that check for: ? Low iron levels (anemia). ? High blood sugar that affects pregnant women (gestational diabetes) between 29 and 28 weeks. ? Rh antibodies. This is to check for a protein on red blood cells (Rh factor).  Urine tests to check for infections, diabetes, or protein in the urine.  An ultrasound to confirm the proper growth and development of the baby.  An amniocentesis to check for possible genetic problems.  Fetal screens for spina bifida and Down syndrome.  HIV (human immunodeficiency virus) testing. Routine prenatal testing includes screening for HIV, unless you choose not to have this test.  Follow these instructions at home: Medicines  Follow your health care provider's instructions regarding medicine use. Specific medicines may be either safe or unsafe to take during pregnancy.  Take a prenatal vitamin that contains at least 600 micrograms (mcg) of folic acid.  If you develop constipation, try taking a stool softener if your health care provider approves. Eating and drinking  Eat a balanced diet that includes fresh fruits and vegetables,  whole grains, good sources of protein such as meat, eggs, or tofu, and low-fat dairy. Your health care provider will help you determine the amount of weight gain that is right for you.  Avoid raw meat and uncooked cheese. These carry germs that can cause birth defects in the baby.  If you have low calcium intake from food, talk to your health care provider about whether you should take a daily calcium supplement.  Limit foods that are high in fat and processed sugars, such as fried and sweet foods.  To prevent constipation: ? Drink enough fluid to keep your urine clear or pale yellow. ? Eat foods that are high in fiber, such as fresh fruits and vegetables, whole grains, and beans. Activity  Exercise only as directed by your health care provider. Most women can continue their usual exercise routine during pregnancy. Try to exercise for 30 minutes at least 5 days a week. Stop exercising if you  experience uterine contractions.  Avoid heavy lifting, wear low heel shoes, and practice good posture.  A sexual relationship may be continued unless your health care provider directs you otherwise. Relieving pain and discomfort  Wear a good support bra to prevent discomfort from breast tenderness.  Take warm sitz baths to soothe any pain or discomfort caused by hemorrhoids. Use hemorrhoid cream if your health care provider approves.  Rest with your legs elevated if you have leg cramps or low back pain.  If you develop varicose veins, wear support hose. Elevate your feet for 15 minutes, 3-4 times a day. Limit salt in your diet. Prenatal Care  Write down your questions. Take them to your prenatal visits.  Keep all your prenatal visits as told by your health care provider. This is important. Safety  Wear your seat belt at all times when driving.  Make a list of emergency phone numbers, including numbers for family, friends, the hospital, and police and fire departments. General  instructions  Ask your health care provider for a referral to a local prenatal education class. Begin classes no later than the beginning of month 6 of your pregnancy.  Ask for help if you have counseling or nutritional needs during pregnancy. Your health care provider can offer advice or refer you to specialists for help with various needs.  Do not use hot tubs, steam rooms, or saunas.  Do not douche or use tampons or scented sanitary pads.  Do not cross your legs for long periods of time.  Avoid cat litter boxes and soil used by cats. These carry germs that can cause birth defects in the baby and possibly loss of the fetus by miscarriage or stillbirth.  Avoid all smoking, herbs, alcohol, and unprescribed drugs. Chemicals in these products can affect the formation and growth of the baby.  Do not use any products that contain nicotine or tobacco, such as cigarettes and e-cigarettes. If you need help quitting, ask your health care provider.  Visit your dentist if you have not gone yet during your pregnancy. Use a soft toothbrush to brush your teeth and be gentle when you floss. Contact a health care provider if:  You have dizziness.  You have mild pelvic cramps, pelvic pressure, or nagging pain in the abdominal area.  You have persistent nausea, vomiting, or diarrhea.  You have a bad smelling vaginal discharge.  You have pain when you urinate. Get help right away if:  You have a fever.  You are leaking fluid from your vagina.  You have spotting or bleeding from your vagina.  You have severe abdominal cramping or pain.  You have rapid weight gain or weight loss.  You have shortness of breath with chest pain.  You notice sudden or extreme swelling of your face, hands, ankles, feet, or legs.  You have not felt your baby move in over an hour.  You have severe headaches that do not go away when you take medicine.  You have vision changes. Summary  The second trimester  is from week 14 through week 27 (months 4 through 6). It is also a time when the fetus is growing rapidly.  Your body goes through many changes during pregnancy. The changes vary from woman to woman.  Avoid all smoking, herbs, alcohol, and unprescribed drugs. These chemicals affect the formation and growth your baby.  Do not use any tobacco products, such as cigarettes, chewing tobacco, and e-cigarettes. If you need help quitting, ask your health care provider.  Contact your health care provider if you have any questions. Keep all prenatal visits as told by your health care provider. This is important. This information is not intended to replace advice given to you by your health care provider. Make sure you discuss any questions you have with your health care provider. Document Released: 05/16/2001 Document Revised: 10/28/2015 Document Reviewed: 07/23/2012 Elsevier Interactive Patient Education  2017 [place] After Cesarean Delivery A trial of labor after cesarean delivery (TOLAC) is when a woman tries to give birth vaginally after a previous cesarean delivery. TOLAC may be a safe and appropriate option for you depending on your medical history and other risk factors. When TOLAC is successful and you are able to have a vaginal delivery, this is called a vaginal birth after cesarean delivery (VBAC). Candidates for TOLAC TOLAC is possible for some women who: Have undergone one or two prior cesarean deliveries in which the incision of the uterus was horizontal (low transverse). Are carrying twins and have had one prior low transverse incision during a cesarean delivery. Do not have a vertical (classical) uterine scar. Have not had a tear in the wall of their uterus (uterine rupture).  TOLAC is also supported for women who meet appropriate criteria and: Are under the age of 12 years. Are tall and have a body mass index (BMI) of less than 30. Have an unknown uterine  scar. Give birth in a facility equipped to handle an emergency cesarean delivery. This team should be able to handle possible complications such as a uterine rupture. Have thorough counseling about the benefits and risks of TOLAC. Have discussed future pregnancy plans with their health care provider. Plan to have several more pregnancies.  Most successful candidates for TOLAC: Have had a successful vaginal delivery before or after their cesarean delivery. Experience labor that begins naturally on or before the due date (40 weeks of gestation). Do not have a very large (macrosomic) baby. Had a prior cesarean delivery but are not currently experiencing factors that would prompt a cesarean delivery (such as a breech position). Had only one prior cesarean delivery. Had a prior cesarean delivery that was performed early in labor and not after full cervical dilation. TOLAC may be most appropriate for women who meet the above guidelines and who plan to have more pregnancies. TOLAC is not recommended for home births. Least successful candidates for TOLAC: Have an induced labor with an unfavorable cervix. An unfavorable cervix is when the cervix is not dilating enough (among other factors). Have never had a vaginal delivery. Have had more than two cesarean deliveries. Have a pregnancy at more than 40 weeks of gestation. Are pregnant with a baby with a suspected weight greater than 4,000 grams (8 pounds) and who have no prior history of a vaginal delivery. Have closely spaced pregnancies. Suggested benefits of TOLAC You may have a faster recovery time. You may have a shorter stay in the hospital. You may have less pain and fewer problems than with a cesarean delivery. Women who have a cesarean delivery have a higher chance of needing blood or getting a fever, an infection, or a blood clot in the legs. Suggested risks of TOLAC The highest risk of complications happens to women who attempt a TOLAC and  fail. A failed TOLAC results in an unplanned cesarean delivery. Risks related to [facility] or repeat cesarean deliveries include: Blood loss. Infection. Blood clot. Injury to surrounding tissues or organs. Having to remove the uterus (hysterectomy). Potential  problems with the placenta (such as placenta previa or placenta accreta) in future pregnancies.  Although very rare, the main concerns with TOLAC are: Rupture of the uterine scar from a past cesarean delivery. Needing an emergency cesarean delivery. Having a bad outcome for the baby (perinatal morbidity).  Where to find more information: [employer]: www.acog.[place]: www.midwife.org This information is not intended to replace advice given to you by your health care provider. Make sure you discuss any questions you have with your health care provider. Document Released: 02/07/2011 Document Revised: 04/19/2016 Document Reviewed: 11/11/2012 Elsevier Interactive Patient Education  2018 [employer]. Common Medications Safe in Pregnancy  Acne:      Constipation:  Benzoyl Peroxide     Colace  Clindamycin      Dulcolax Suppository  Topica Erythromycin     Fibercon  Salicylic Acid      Metamucil         Miralax AVOID:        Senakot   Accutane    Cough:  Retin-A       Cough Drops  Tetracycline      Phenergan w/ Codeine if Rx  Minocycline      Robitussin (Plain & DM)  Antibiotics:     Crabs/Lice:  Ceclor       RID  Cephalosporins    AVOID:  E-Mycins      Kwell  Keflex  Macrobid/Macrodantin   Diarrhea:  Penicillin      Kao-Pectate  Zithromax      Imodium AD         PUSH FLUIDS AVOID:       Cipro     Fever:  Tetracycline      Tylenol (Regular or Extra  Minocycline       Strength)  Levaquin      Extra Strength-Do not          Exceed 8 tabs/24 hrs Caffeine:        <262m/day (equiv. To 1 cup of coffee or  approx. 3 12 oz  sodas)         Gas: Cold/Hayfever:       Gas-X  Benadryl      Mylicon  Claritin       Phazyme  **Claritin-D        Chlor-Trimeton    Headaches:  Dimetapp      ASA-Free Excedrin  Drixoral-Non-Drowsy     Cold Compress  Mucinex (Guaifenasin)     Tylenol (Regular or Extra  Sudafed/Sudafed-12 Hour     Strength)  **Sudafed PE Pseudoephedrine   Tylenol Cold & Sinus     Vicks Vapor Rub  Zyrtec  **AVOID if Problems With Blood Pressure         Heartburn: Avoid lying down for at least 1 hour after meals  Aciphex      Maalox     Rash:  Milk of Magnesia     Benadryl    Mylanta       1% Hydrocortisone Cream  Pepcid  Pepcid Complete   Sleep Aids:  Prevacid      Ambien   Prilosec       Benadryl  Rolaids       Chamomile Tea  Tums (Limit 4/day)     Unisom  Zantac       Tylenol PM         Warm milk-add vanilla or  Hemorrhoids:  Sugar for taste  Anusol/Anusol H.C.  (RX: Analapram 2.5%)  Sugar Substitutes:  Hydrocortisone OTC     Ok in moderation  Preparation H      Tucks        Vaseline lotion applied to tissue with wiping    Herpes:     Throat:  Acyclovir      Oragel  Famvir  Valtrex     Vaccines:         Flu Shot Leg Cramps:       *Gardasil  Benadryl      Hepatitis A         Hepatitis B Nasal Spray:       Pneumovax  Saline Nasal Spray     Polio Booster         Tetanus Nausea:       Tuberculosis test or PPD  Vitamin B6 25 mg TID   AVOID:    Dramamine      *Gardasil  Emetrol       Live Poliovirus  Ginger Root 250 mg QID    MMR (measles, mumps &  High Complex Carbs @ Bedtime    rebella)  Sea Bands-Accupressure    Varicella (Chickenpox)  Unisom 1/2 tab TID     *No known complications           If received before Pain:         Known pregnancy;   Darvocet       Resume series after  Lortab        Delivery  Percocet    Yeast:   Tramadol      Femstat  Tylenol 3      Gyne-lotrimin  Ultram       Monistat  Vicodin           MISC:         All  Sunscreens           Hair Coloring/highlights          Insect Repellant's          (Including DEET)         Mystic Tans  Heartburn During Pregnancy Heartburn is a type of pain or discomfort that can happen in the throat or chest. It is often described as a burning sensation. Heartburn is common during pregnancy because:  A hormone (progesterone) that is released during pregnancy may relax the valve (lower esophageal sphincter, or LES) that separates the esophagus from the stomach. This allows stomach acid to move up into the esophagus, causing heartburn.  The uterus gets larger and pushes up on the stomach, which pushes more acid into the esophagus. This is especially true in the later stages of pregnancy.  Heartburn usually goes away or gets better after giving birth. What are the causes? Heartburn is caused by stomach acid backing up into the esophagus (reflux). Reflux can be triggered by:  Changing hormone levels.  Large meals.  Certain foods and beverages, such as coffee, chocolate, onions, and peppermint.  Exercise.  Increased stomach acid production.  What increases the risk? You are more likely to experience heartburn during pregnancy if you:  Had heartburn prior to becoming pregnant.  Have been pregnant more than once before.  Are overweight or obese.  The likelihood that you will get heartburn also increases as you get farther along in your pregnancy, especially during the last trimester. What are the signs or symptoms? Symptoms of this condition include:  Burning pain in the  chest or lower throat.  Bitter taste in the mouth.  Coughing.  Problems swallowing.  Vomiting.  Hoarse voice.  Asthma.  Symptoms may get worse when you lie down or bend over. Symptoms are often worse at night. How is this diagnosed? This condition is diagnosed based on:  Your medical history.  Your symptoms.  Blood tests to check for a certain type of bacteria associated  with heartburn.  Whether taking heartburn medicine relieves your symptoms.  Examination of the stomach and esophagus using a tube with a light and camera on the end (endoscopy).  How is this treated? Treatment varies depending on how severe your symptoms are. Your health care provider may recommend:  Over-the-counter medicines (antacids or acid reducers) for mild heartburn.  Prescription medicines to decrease stomach acid or to protect your stomach lining.  Certain changes in your diet.  Raising the head of your bed so it is higher than the foot of the bed. This helps prevent stomach acid from backing up into the esophagus when you are lying down.  Follow these instructions at home: Eating and drinking  Do not drink alcohol during your pregnancy.  Identify foods and beverages that make your symptoms worse, and avoid them.  Beverages that you may want to avoid include: ? Coffee and tea (with or without caffeine). ? Energy drinks and sports drinks. ? Carbonated drinks or sodas. ? Citrus fruit juices.  Foods that you may want to avoid include: ? Chocolate and cocoa. ? Peppermint and mint flavorings. ? Garlic, onions, and horseradish. ? Spicy and acidic foods, including peppers, chili powder, curry powder, vinegar, hot sauces, and barbecue sauce. ? Citrus fruits, such as oranges, lemons, and limes. ? Tomato-based foods, such as red sauce, chili, and salsa. ? Fried and fatty foods, such as donuts, french fries, potato chips, and high-fat dressings. ? High-fat meats, such as hot dogs, cold cuts, sausage, ham, and bacon. ? High-fat dairy items, such as whole milk, butter, and cheese.  Eat small, frequent meals instead of large meals.  Avoid drinking large amounts of liquid with your meals.  Avoid eating meals during the 2-3 hours before bedtime.  Avoid lying down right after you eat.  Do not exercise right after you eat. Medicines  Take over-the-counter and prescription  medicines only as told by your health care provider.  Do not take aspirin, ibuprofen, or other NSAIDs unless your health care provider tells you to do that.  You may be instructed to avoid medicines that contain sodium bicarbonate. General instructions  If directed, raise the head of your bed about 6 inches (15 cm) by putting blocks under the legs. Sleeping with more pillows does not effectively relieve heartburn because it only changes the position of your head.  Do not use any products that contain nicotine or tobacco, such as cigarettes and e-cigarettes. If you need help quitting, ask your health care provider.  Wear loose-fitting clothing.  Try to reduce your stress, such as with yoga or meditation. If you need help managing stress, ask your health care provider.  Maintain a healthy weight. If you are overweight, work with your health care provider to safely lose weight.  Keep all follow-up visits as told by your health care provider. This is important. Contact a health care provider if:  You develop new symptoms.  Your symptoms do not improve with treatment.  You have unexplained weight loss.  You have difficulty swallowing.  You make loud sounds when you breathe (wheeze).  You have a cough that does not go away.  You have frequent heartburn for more than 2 weeks.  You have nausea or vomiting that does not get better with treatment.  You have pain in your abdomen. Get help right away if:  You have severe chest pain that spreads to your arm, neck, or jaw.  You feel sweaty, dizzy, or light-headed.  You have shortness of breath.  You have pain when swallowing.  You vomit, and your vomit looks like blood or coffee grounds.  Your stool is bloody or black. This information is not intended to replace advice given to you by your health care provider. Make sure you discuss any questions you have with your health care provider. Document Released: 05/19/2000 Document  Revised: 02/07/2016 Document Reviewed: 02/07/2016 Elsevier Interactive Patient Education  2018 [employer].

## 2017-02-02 ENCOUNTER — Other Ambulatory Visit: Payer: Self-pay | Admitting: Obstetrics and Gynecology

## 2017-02-02 DIAGNOSIS — O30042 Twin pregnancy, dichorionic/diamniotic, second trimester: Secondary | ICD-10-CM

## 2017-02-16 ENCOUNTER — Encounter: Payer: BLUE CROSS/BLUE SHIELD | Admitting: Obstetrics and Gynecology

## 2017-02-16 ENCOUNTER — Other Ambulatory Visit: Payer: BLUE CROSS/BLUE SHIELD

## 2017-02-26 ENCOUNTER — Ambulatory Visit: Payer: Medicaid Other

## 2017-02-26 ENCOUNTER — Ambulatory Visit (INDEPENDENT_AMBULATORY_CARE_PROVIDER_SITE_OTHER): Payer: Medicaid Other

## 2017-02-26 DIAGNOSIS — O30042 Twin pregnancy, dichorionic/diamniotic, second trimester: Secondary | ICD-10-CM | POA: Diagnosis not present

## 2017-02-27 ENCOUNTER — Ambulatory Visit (INDEPENDENT_AMBULATORY_CARE_PROVIDER_SITE_OTHER): Payer: Medicaid Other | Admitting: Certified Nurse Midwife

## 2017-02-27 VITALS — BP 105/73 | HR 85 | Wt 166.0 lb

## 2017-02-27 DIAGNOSIS — Z3492 Encounter for supervision of normal pregnancy, unspecified, second trimester: Secondary | ICD-10-CM

## 2017-02-27 LAB — POCT URINALYSIS DIPSTICK
Bilirubin, UA: NEGATIVE
Ketones, UA: NEGATIVE
NITRITE UA: NEGATIVE
PH UA: 7 (ref 5.0–8.0)
PROTEIN UA: NEGATIVE
RBC UA: NEGATIVE
Spec Grav, UA: 1.01 (ref 1.010–1.025)
UROBILINOGEN UA: 0.2 U/dL

## 2017-02-27 NOTE — Patient Instructions (Signed)

## 2017-02-27 NOTE — Progress Notes (Signed)
ROB doing well with no complaints. Pt had anatomy scan yesterday. Anatomy scan abnormal ( see below for full report) . Ultrasound results reviewed with pt. Discussed MFM referral. She verbalizes understanding and agrees to plan.   Doreene Burke, CNM

## 2017-02-27 NOTE — Progress Notes (Signed)
Location: [facility] Date of Service: 02/26/17  Indications: Anatomy for Di-Di Twins Findings:   BABY A:  Di-Di twin intrauterine pregnancy is visualized with FHR at 143 BPM. Biometrics give an (U/S) Gestational age of [redacted] weeks 3 days, and an (U/S) EDD of 07/06/17 ; this correlates with the clinically established EDD of 07/05/17.  Fetal presentation is vertex, spine variable, maternal right.  EFW: 469 grams ( 1 lb. 1 oz). Placenta: Anterior, grade 1 and remote to cervix at 8.9 cm with a marginal cord insert. AFI: Subjectively adequate with an MVP of 3.8 cm.  Anatomic survey is complete and ABNORMAL. The fetal head shows a "lemon sign" cranium as well as a " banana sign" cerebellum compatible with a spinal abnormality. 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 Also seen is bilateral club feet, a 2 vessel cord, a slightly dilated right renal pelvis at 4.8 mm, and a possible septal defect seen in the 4 chamber view of the fetal heart. Gender - Female.   BABY B:  Di-Di twin intrauterine pregnancy is visualized with FHR at 143 BPM. Biometrics give an (U/S) Gestational age of [redacted] weeks, and an (U/S) EDD of 07/02/17 ; this correlates with the clinically established EDD of 07/05/17.  Fetal presentation is breech, spine variable, maternal left.  EFW: 475 grams ( 1 lb. 1 oz). Placenta: Posterior/fundal, grade 1 and remote to cervix at 9.6 cm, and appears to be a velamentous insertion. AFI: Subjectively adequate with an MVP of 5.4 cm.  Anatomic survey is complete and appears WNL. Gender - Female.   A "Twin Peak" sign membrane is seen compatible with a Di-Di Twin pregnancy.  Right and left ovaries were not visualized. There is no evidence of a corpus luteal cyst. Survey of the adnexa demonstrates no adnexal masses. There is no free peritoneal fluid in the cul de sac.  Impression: 1. 21 week 3 day (baby A), and 22 week (baby B) Viable Di-Di twin  Intrauterine pregnancy by U/S. 2. (U/S) EDD is consistent with Clinically established (LMP) EDD of 07/05/17. 3. Both cord inserts appear abnormal with Baby A's Anterior cord insert appearing marginal at only 2.67 cm from the placental edge and baby B's cord insert appearing to be a velamentous insertion into its posterior placenta. 4. Baby A has multiple abnormalities which are described in the body of this report. 5. Baby B's anatomy appears WNL.

## 2017-02-27 NOTE — Progress Notes (Signed)
ROB- pt is still vomiting, declines flu vaccine

## 2017-03-01 ENCOUNTER — Other Ambulatory Visit: Payer: Self-pay | Admitting: *Deleted

## 2017-03-01 DIAGNOSIS — O289 Unspecified abnormal findings on antenatal screening of mother: Secondary | ICD-10-CM

## 2017-03-01 LAB — URINE CULTURE

## 2017-03-05 ENCOUNTER — Ambulatory Visit
Admission: RE | Admit: 2017-03-05 | Discharge: 2017-03-05 | Disposition: A | Discharge status: Home / Self Care | Payer: Medicaid Other | Source: Ambulatory Visit | Attending: Obstetrics and Gynecology | Admitting: Obstetrics and Gynecology

## 2017-03-05 ENCOUNTER — Ambulatory Visit (HOSPITAL_BASED_OUTPATIENT_CLINIC_OR_DEPARTMENT_OTHER)
Admission: RE | Admit: 2017-03-05 | Discharge: 2017-03-05 | Disposition: A | Discharge status: Home / Self Care | Payer: Medicaid Other | Source: Ambulatory Visit | Attending: Obstetrics and Gynecology | Admitting: Obstetrics and Gynecology

## 2017-03-05 DIAGNOSIS — O350XX1 Maternal care for (suspected) central nervous system malformation in fetus, fetus 1: Secondary | ICD-10-CM

## 2017-03-05 DIAGNOSIS — O3508X0 Maternal care for (suspected) central nervous system malformation or damage in fetus, spina bifida, not applicable or unspecified: Secondary | ICD-10-CM | POA: Insufficient documentation

## 2017-03-05 DIAGNOSIS — O3509X0 Maternal care for (suspected) other central nervous system malformation or damage in fetus, not applicable or unspecified: Secondary | ICD-10-CM | POA: Insufficient documentation

## 2017-03-05 DIAGNOSIS — O350XX0 Maternal care for (suspected) central nervous system malformation in fetus, not applicable or unspecified: Secondary | ICD-10-CM

## 2017-03-05 DIAGNOSIS — O289 Unspecified abnormal findings on antenatal screening of mother: Secondary | ICD-10-CM

## 2017-03-05 NOTE — Progress Notes (Addendum)
Referring physician:  [facility] 30 minute consultation  Hailey Jones and her partner were referred for a detailed ultrasound and genetic counseling at [facility] due to findings on prior ultrasound at [facility] suggestive of an open neural tube defect in twin A. This is a summary of Hailey conversation.  Please see the ultrasound report for those results and comments from Hailey Jones.  Today's ultrasound at 22 weeks, 4 days, showed normal fetal anatomy for twin B.  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  In addition, twin A has a two vessel cord and mild pyelectasis.  These findings were reviewed with the couple along with the option of additional testing for chromosome conditions through cell free fetal DNA testing or amniocentesis.  Though most cases of neural tube defects are not associated with chromosome conditions, this combination of ultrasound findings does increase the risk for aneuploidy. Hailey Jones does not desire amniocentesis due to the risk of complications and would not change the course of the pregnancy based upon either the ultrasound findings or due to aneuploidy, so they declined this cell free fetal DNA testing as well.  The patient reported no exposures in the pregnancy that would be expected to increase the risk for birth defects and no complications in the pregnancy other than severe nausea and vomiting.  We inquired briefly about the family history and she reported two maternal half siblings with health concerns.  Her 19 year old brother has behavioral issues that were initially thought to be autism but seem to be improving since he was found to have extra fluid on his spine.  This fluid was removed and now he is having increased speech and fewer behavioral problems. He  attends a special school.  Hailey Jones also had a maternal half sister who passed away at age 26 years.  She is reported to have had a Dandy Walker malformation, possible spina bifida that did not require treatment, was unable to walk or talk and had seizures.  Hailey Jones stated that the family has attributed the health problems in both of those siblings to their mother's substance use in pregnancy.  We reviewed that more medical information on both of these siblings may be helpful in determining possible underlying inherited factors in this family.  However, at this time we do not expect this history to alter the management of the current pregnancy.  A Medical Genetics evaluation on Twin A after delivery could help provide additional information about possible genetic syndromes. No other family history of genetic conditions, developmental delays or birth defects were reported.  Moving forward, we recommend transfer of care to a tertiary care center with additional imaging to include 3D reconstruction, fetal echocardiogram, neurosurgery consultation and NICU consultation.  The couple was trying to decide where would be most convenient for their family Hailey Jones, Hailey Jones or Hailey Jones). They will contact Hailey office and let us know their decision, at which time we will forward records as appropriate for appointments to be scheduled.  Hailey Anderson, MS, CGC  Hailey Stack, MS, CGC performed an integral service incident to the physician's initial service.  I was physically present in the clinical area and was immediately available to render assistance.   Hailey Jones

## 2017-03-08 ENCOUNTER — Telehealth: Payer: Self-pay | Admitting: Obstetrics and Gynecology

## 2017-03-08 NOTE — Telephone Encounter (Signed)
Ms. Desrocher called in follow up to our visit in Monday, 03/05/2017 to let us know their decision about where they would like to received care for the rest of the pregnancy. They prefer [facility].  I spoke with Werner Lean, Maternal Fetal Care Coordinator at [facility] today and faxed our records to her.  She will contact the patient in the next couple of days to arrange appointments.  I left a message at [facility] notifying them of her decision.  Cherly Anderson, MS, CGC

## 2017-03-17 ENCOUNTER — Observation Stay
Admission: EM | Admit: 2017-03-17 | Discharge: 2017-03-18 | Disposition: A | Discharge status: Home / Self Care | Payer: Medicaid Other | Attending: Obstetrics and Gynecology | Admitting: Obstetrics and Gynecology

## 2017-03-17 DIAGNOSIS — O26892 Other specified pregnancy related conditions, second trimester: Principal | ICD-10-CM | POA: Insufficient documentation

## 2017-03-17 DIAGNOSIS — R109 Unspecified abdominal pain: Secondary | ICD-10-CM | POA: Insufficient documentation

## 2017-03-17 DIAGNOSIS — Z3A26 26 weeks gestation of pregnancy: Secondary | ICD-10-CM | POA: Insufficient documentation

## 2017-03-17 NOTE — OB Triage Note (Signed)
Pt G2P1 complains of right abdominal pain that started in her back and has now started to hurt in her right abdomen. Pt states pain is a 5/10. Pt denies vaginal bleeding and leaking of fluid. Pt denies blood in her urine. VSS. Monitors applied and assessing.

## 2017-03-18 DIAGNOSIS — O26892 Other specified pregnancy related conditions, second trimester: Secondary | ICD-10-CM | POA: Diagnosis not present

## 2017-03-18 DIAGNOSIS — Z3A26 26 weeks gestation of pregnancy: Secondary | ICD-10-CM | POA: Diagnosis not present

## 2017-03-18 DIAGNOSIS — O2692 Pregnancy related conditions, unspecified, second trimester: Secondary | ICD-10-CM | POA: Diagnosis present

## 2017-03-18 DIAGNOSIS — R109 Unspecified abdominal pain: Secondary | ICD-10-CM | POA: Diagnosis not present

## 2017-03-18 LAB — URINALYSIS, ROUTINE W REFLEX MICROSCOPIC
Bilirubin Urine: NEGATIVE
GLUCOSE, UA: NEGATIVE mg/dL
Ketones, ur: 5 mg/dL — AB
Leukocytes, UA: NEGATIVE
Nitrite: NEGATIVE
PH: 7 (ref 5.0–8.0)
Protein, ur: NEGATIVE mg/dL
SPECIFIC GRAVITY, URINE: 1.013 (ref 1.005–1.030)

## 2017-03-18 MED ORDER — ACETAMINOPHEN 500 MG PO TABS: 1000.0000 mg | ORAL_TABLET | Freq: Four times a day (QID) | ORAL | Status: DC | PRN

## 2017-03-18 MED ORDER — LACTATED RINGERS IV BOLUS (SEPSIS)
300.0000 mL | Freq: Once | INTRAVENOUS | Status: AC
  Administered 2017-03-18: 300 mL via INTRAVENOUS

## 2017-03-18 MED ORDER — LACTATED RINGERS IV SOLN: INTRAVENOUS | Status: DC

## 2017-03-18 MED ORDER — NITROFURANTOIN MONOHYD MACRO 100 MG PO CAPS
100.0000 mg | ORAL_CAPSULE | Freq: Two times a day (BID) | ORAL | Status: DC
  Administered 2017-03-18: 100 mg via ORAL
  Filled 2017-03-18: qty 1

## 2017-03-18 MED ORDER — ZOLPIDEM TARTRATE 5 MG PO TABS: 5.0000 mg | ORAL_TABLET | Freq: Once | ORAL | Status: DC | PRN

## 2017-03-19 LAB — URINE CULTURE: Culture: <10000 — AB

## 2017-03-28 ENCOUNTER — Ambulatory Visit (INDEPENDENT_AMBULATORY_CARE_PROVIDER_SITE_OTHER): Payer: Medicaid Other | Admitting: Obstetrics and Gynecology

## 2017-03-28 VITALS — BP 112/80 | HR 88 | Wt 172.0 lb

## 2017-03-28 DIAGNOSIS — Z23 Encounter for immunization: Secondary | ICD-10-CM

## 2017-03-28 DIAGNOSIS — Z3492 Encounter for supervision of normal pregnancy, unspecified, second trimester: Secondary | ICD-10-CM

## 2017-03-28 LAB — POCT URINALYSIS DIPSTICK
Bilirubin, UA: NEGATIVE
Glucose, UA: NEGATIVE
Ketones, UA: NEGATIVE
Leukocytes, UA: NEGATIVE
Nitrite, UA: NEGATIVE
PH UA: 6.5 (ref 5.0–8.0)
PROTEIN UA: NEGATIVE
RBC UA: NEGATIVE
SPEC GRAV UA: 1.01 (ref 1.010–1.025)
UROBILINOGEN UA: 0.2 U/dL

## 2017-03-28 NOTE — Progress Notes (Signed)
ROB- pt is doing well 

## 2017-03-28 NOTE — Progress Notes (Signed)
ROB- [facility]- will have next visit there. Flu vaccine given today, will do glucola there.

## 2017-04-04 NOTE — Discharge Summary (Signed)
L&D OB Triage Note  Hailey Jones is a 26 y.o. G2P1001 female at [id number], EDD Estimated Date of Delivery: 07/05/17 who presented to triage for complaints of lower abdominal pain that radiated to abdomen on right side, denies bleeding or LOF, just this colicky pain.  She was evaluated by the nurses with no significant findings/findings significant for preterm contractions or UTI in pregnancy. Vital signs stable. An NST was performed and has been reviewed by Me. She was treated with po hydration.   NST INTERPRETATION: Indications: rule out uterine contractions  Mode: Doppler Baseline Rate (A): 152 bpm Variability: Moderate Accelerations: 15 x 15 Decelerations: None     Contraction Frequency (min): none noted  Impression: reactive NST, Round ligament pain   Plan: NST performed was reviewed and was found to be reactive. She was discharged home with bleeding/labor precautions.  Continue routine prenatal care. Follow up with OB/GYN as previously scheduled.     Lacreasha Hinds Suzan NailerN Cerys Winget, CNM

## 2017-08-20 ENCOUNTER — Encounter: Payer: Self-pay | Admitting: Obstetrics and Gynecology

## 2017-08-20 ENCOUNTER — Ambulatory Visit (INDEPENDENT_AMBULATORY_CARE_PROVIDER_SITE_OTHER): Payer: Medicaid Other | Admitting: Obstetrics and Gynecology

## 2017-08-20 ENCOUNTER — Telehealth: Payer: Self-pay | Admitting: Obstetrics and Gynecology

## 2017-08-20 VITALS — BP 118/74 | Temp 98.8°F | Wt 150.0 lb

## 2017-08-20 DIAGNOSIS — O9123 Nonpurulent mastitis associated with lactation: Secondary | ICD-10-CM

## 2017-08-20 MED ORDER — DICLOXACILLIN SODIUM 500 MG PO CAPS
500.0000 mg | ORAL_CAPSULE | Freq: Four times a day (QID) | ORAL | 0 refills | Status: AC
Start: 2017-08-20 — End: 2017-08-30

## 2017-08-20 NOTE — Progress Notes (Signed)
Obstetrics & Gynecology Office Visit   Chief Complaint  Patient presents with  . Breast Pain    History of Present Illness: 27 y.o. G74P1103 female who is an established patient of this practice who is status post preterm repeat LTCS at [facility].  Pregnancy complicated by Di-Di twins, twin B with fetal MMC (myelomeningocele).  Surgery on 06/13/17.  She reports right breast pain, sharp and shooting for the past 5 days.  She has had intermittent fever and chills.  She is both breast feeding and pumping.  She has tried hot showers and massaging her breast, with occasional relief, but overall still in pain.  Nothing makes it feel better, nothing makes it feel worse. No other associated symptoms, unless already noted.  Past Medical History:  Diagnosis Date  . Medical history non-contributory   . Single delivery by C-section 04/22/2015    Past Surgical History:  Procedure Laterality Date  . CESAREAN SECTION N/A 04/22/2015   Procedure: CESAREAN SECTION;  Surgeon: [place] Bing, MD;  Location: [facility] ORS;  Service: Obstetrics;  Laterality: N/A;  . WISDOM TOOTH EXTRACTION  2011    Gynecologic History: Patient's last menstrual period was 09/20/2016 (exact date).  Obstetric History: G2P1103  Family History  Problem Relation Age of Onset  . Heart disease Paternal Grandfather   . Hypertension Father   . Hypertension Maternal Grandmother   . Heart disease Maternal Grandfather   . Hypertension Paternal Grandmother   . Heart disease Paternal Grandmother     Social History   Socioeconomic History  . Marital status: Married    Spouse name: Not on file  . Number of children: 0  . Years of education: Not on file  . Highest education level: Not on file  Social Needs  . Financial resource strain: Not on file  . Food insecurity - worry: Not on file  . Food insecurity - inability: Not on file  . Transportation needs - medical: Not on file  . Transportation needs - non-medical: Not on file    Occupational History  . Not on file  Tobacco Use  . Smoking status: Never Smoker  . Smokeless tobacco: Never Used  Substance and Sexual Activity  . Alcohol use: No  . Drug use: No  . Sexual activity: Yes  Other Topics Concern  . Not on file  Social History Narrative  . Not on file    No Known Allergies  Prior to Admission medications   Medication Sig Start Date End Date Taking? Authorizing Provider  doxylamine, Sleep, (UNISOM) 25 MG tablet Take 25 mg by mouth at bedtime as needed.    [provider]  Doxylamine-Pyridoxine ER (BONJESTA) 20-20 MG TBCR Take 1 capsule by mouth 2 (two) times daily. Patient not taking: Reported on 03/05/2017 11/07/16   Doreene Burke, CNM  Prenatal Vit-Fe Fumarate-FA (PRENATAL MULTIVITAMIN) TABS tablet Take 1 tablet by mouth daily at 12 noon.    [provider]  promethazine (PHENERGAN) 12.5 MG tablet Take 1 tablet (12.5 mg total) by mouth every 6 (six) hours as needed for nausea or vomiting. Patient not taking: Reported on 02/27/2017 12/20/16   Doreene Burke, CNM    Review of Systems  Constitutional: Positive for chills, fever and malaise/fatigue. Negative for diaphoresis and weight loss.  HENT: Negative.   Respiratory: Negative.   Cardiovascular: Negative.   Gastrointestinal: Negative.   Genitourinary: Negative.   Musculoskeletal: Negative.   Skin:       See HPI  Neurological: Positive for weakness.  Psychiatric/Behavioral:  Negative.      Physical Exam BP 118/74   Temp 98.8 F (37.1 C)   Wt 150 lb (68 kg)   LMP 09/20/2016 (Exact Date)   BMI 26.57 kg/m  Patient's last menstrual period was 09/20/2016 (exact date). Physical Exam  Constitutional: She is oriented to person, place, and time. She appears well-developed and well-nourished.  HENT:  Head: Normocephalic and atraumatic.  Eyes: Conjunctivae are normal. No scleral icterus.  Neck: Normal range of motion. Neck supple.  Cardiovascular: Normal rate and regular  rhythm.  Pulmonary/Chest: Effort normal and breath sounds normal. No respiratory distress. Right breast exhibits skin change and tenderness. Right breast exhibits no inverted nipple, no mass and no nipple discharge.    Musculoskeletal: Normal range of motion.  Neurological: She is alert and oriented to person, place, and time.  Skin:  See breast exam  Psychiatric: She has a normal mood and affect. Her behavior is normal. Judgment normal.    Female chaperone present for pelvic and breast  portions of the physical exam  Assessment: 27 y.o. 612P1001 female here for  1. Nonpurulent mastitis associated with lactation      Plan: Problem List Items Addressed This Visit    None    Visit Diagnoses    Nonpurulent mastitis associated with lactation    -  Primary   Relevant Medications   dicloxacillin (DYNAPEN) 500 MG capsule     Acute problem. Treat empirically as lactational mastitis. If fails treatment, consider treating as yeast infection.  If fails that, imaging would be indicated. Precautions given for lack of improvement and/or worsening symptoms.  Thomasene MohairStephen Chanique Duca, MD 08/20/2017 5:15 PM

## 2017-08-20 NOTE — Telephone Encounter (Signed)
The patient called and stated that she would like to speak with a nurse in regards to her possibly having mastitis or thrush, The patient stated that she needs a prescription. Please advise.

## 2017-08-23 NOTE — Telephone Encounter (Signed)
Error

## 2017-08-24 NOTE — Telephone Encounter (Signed)
Pt went to [facility]

## 2017-09-27 ENCOUNTER — Encounter (HOSPITAL_COMMUNITY): Payer: Self-pay

## 2018-02-13 IMAGING — US US MFM OB DETAIL+14 WK
1 series · 13 of 28 positions shown · non-contrast
Comparison: none

PATIENT INFO:

PERFORMED BY:
SERVICE(S) PROVIDED:
INDICATIONS:
22 weeks gestation of pregnancy
Twin pregnancy
Twin A with multiple anomalies
History of prior c-section
FETAL EVALUATION (FETUS A):
Num Of Fetuses:     2
Fetal Heart         155
Rate(bpm):
Fetal Lie:          Right uterus
Presentation:       Cephalic
Placenta:           Anterior Grade, No previa
Largest Pocket(cm)
4.3
BIOMETRY (FETUS A):
BPD:      51.4  mm     G. Age:  21w 4d         13  %    CI:        70.91   %    70 - 86
FL/HC:      20.8   %    19.2 -
HC:      194.5  mm     G. Age:  21w 5d          9  %    HC/AC:      1.03        1.05 -
AC:      188.2  mm     G. Age:  23w 4d         74  %    FL/BPD:     78.8   %    71 - 87
FL:       40.5  mm     G. Age:  23w 1d         57  %    FL/AC:      21.5   %    20 - 24
HUM:        38  mm     G. Age:  23w 3d         64  %
Est. FW:     564  gm      1 lb 4 oz     65  %     FW Discordancy         2  %
GESTATIONAL AGE (FETUS A):
LMP:           23w 5d        Date:  09/20/16                 EDD:   06/27/17
U/S Today:     22w 4d                                        EDD:   07/05/17
Best:          22w 4d     Det. By:  Early Ultrasound         EDD:   07/05/17
(11/13/16)
ANATOMY (FETUS A):
Cavum:                 CSP visualized         Ductal Arch:            Normal appearance
Ventricles:            See impression         Diaphragm:              Within Normal Limits
Choroid Plexus:        Within Normal Limits   Stomach:                Seen
Cerebellum:            See impression         Abdomen:                Within Normal
Limits
Posterior Fossa:       See impression         Abdominal Wall:         Normal appearance
Face:                  Orbits visualized      Cord Vessels:           2 Vessel Cord
Lips:                  Normal appearance      Kidneys:                Renal pelvis
dilitation on right =
4.1m
Thoracic:              Within Normal Limits   Bladder:                Seen
Heart:                 4-Chamber view         Spine:                  See impression
appears normal
RVOT:                  Normal appearance      Upper Extremities:      Visualized
LVOT:                  Normal appearance      Lower Extremities:      See impression
Aortic Arch:           Normal appearance
Other:  Profile, Bicaval and 3 vessel trachea views appear normal
FETAL EVALUATION (FETUS B):
Fetal Heart         140
Fetal Lie:          Left uterus
Placenta:           Fundal, No previa
4.5
BIOMETRY (FETUS B):
BPD:      57.5  mm     G. Age:  23w 4d         82  %    CI:        74.35   %    70 - 86
FL/HC:      17.1   %    19.2 -
HC:      211.7  mm     G. Age:  23w 2d         64  %    HC/AC:      1.06        1.05 -
AC:       199   mm     G. Age:  24w 4d         92  %    FL/BPD:     63.1   %    71 - 87
FL:       36.3  mm     G. Age:  21w 4d         12  %    FL/AC:      18.2   %    20 - 24
HUM:      36.9  mm     G. Age:  22w 6d         52  %
CER:      23.6  mm     G. Age:  21w 6d         35  %
Est. FW:     577  gm      1 lb 4 oz     71  %     FW Discordancy      0 \ 2 %
GESTATIONAL AGE (FETUS B):
U/S Today:     23w 2d                                        EDD:   06/30/17
ANATOMY (FETUS B):
Ventricles:            Normal appearance      Diaphragm:              Within Normal Limits
Cerebellum:            Within Normal Limits   Abdomen:                Within Normal
Posterior Fossa:       Within Normal Limits   Abdominal Wall:         Normal appearance
Face:                  Orbits visualized      Cord Vessels:           3 vessels
Lips:                  Normal appearance      Kidneys:                Normal appearance
LVOT:                  Normal appearance      Lower Extremities:      Visualized
Other:  Bicaval and 3 vessel trachea view were suboptimally visualized;
profile appears normal
CERVIX UTERUS ADNEXA:
Cervix
Length:            4.5  cm.

[Series 1: us mfm ob detail+14 wk · 0.22mm/px · 13 of 156 slices shown]
[im 6/156]
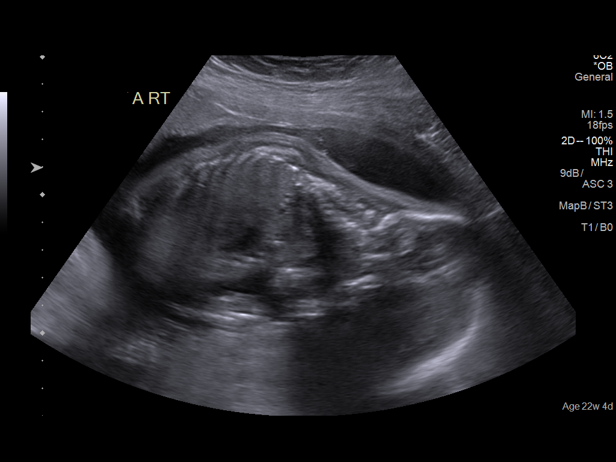
[im 18/156]
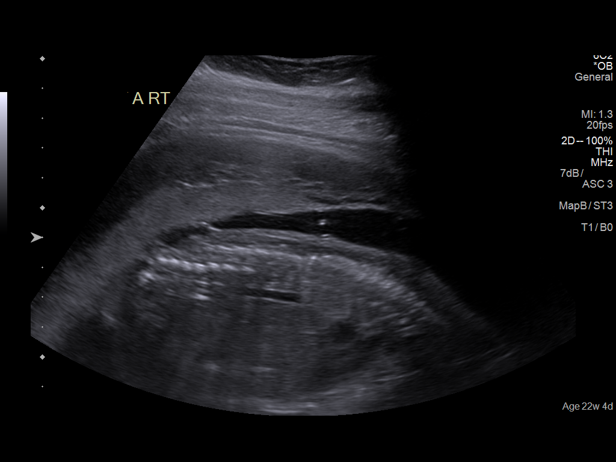
[im 29/156]
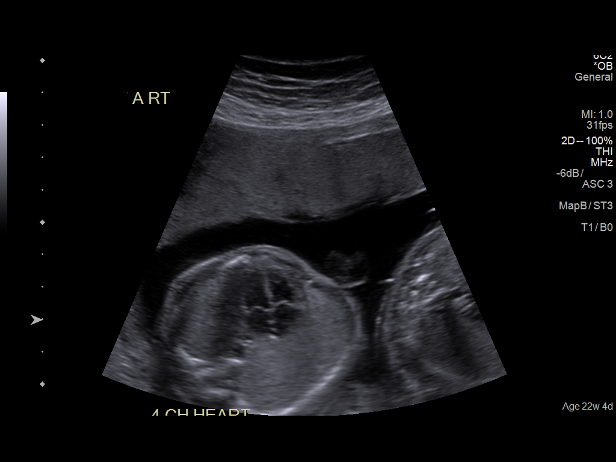
[im 41/156]
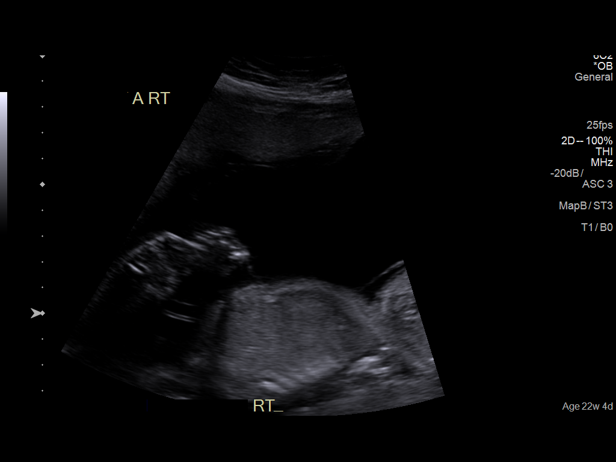
[im 52/156]
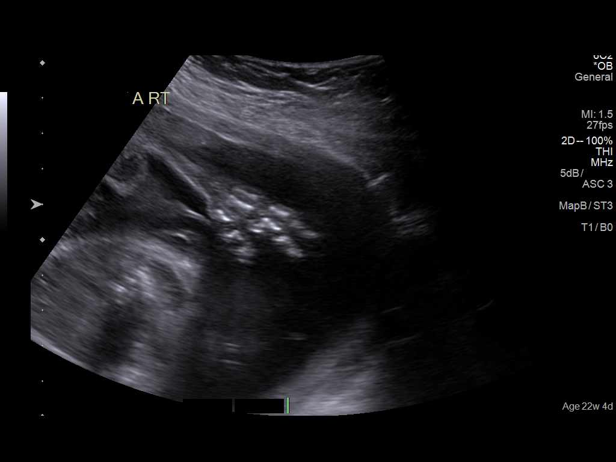
[im 64/156]
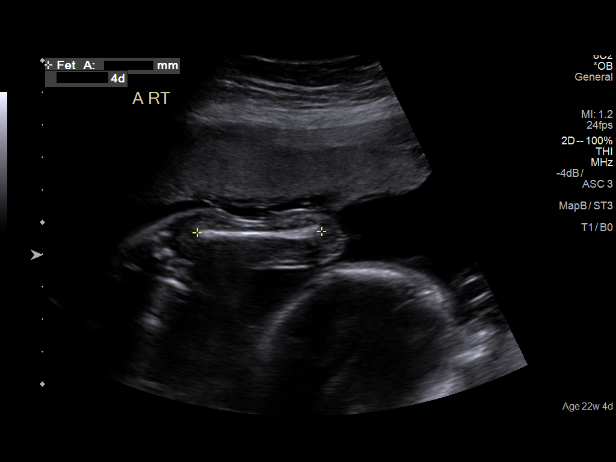
[im 81/156]
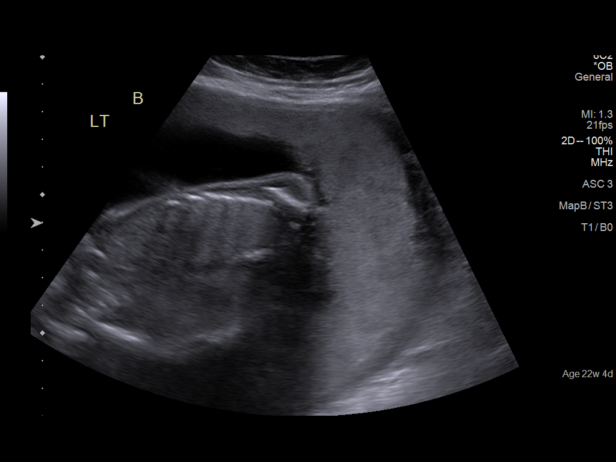
[im 92/156]
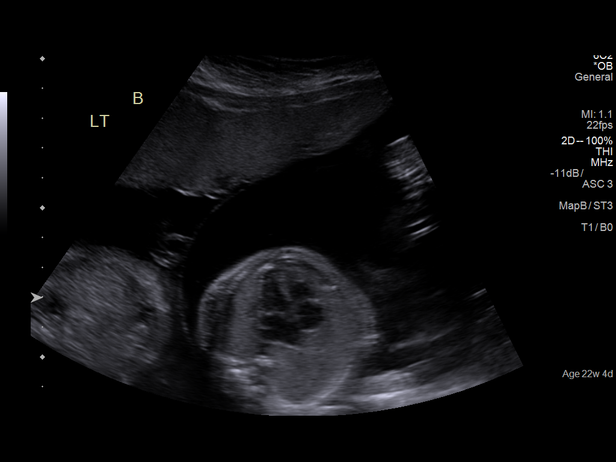
[im 104/156]
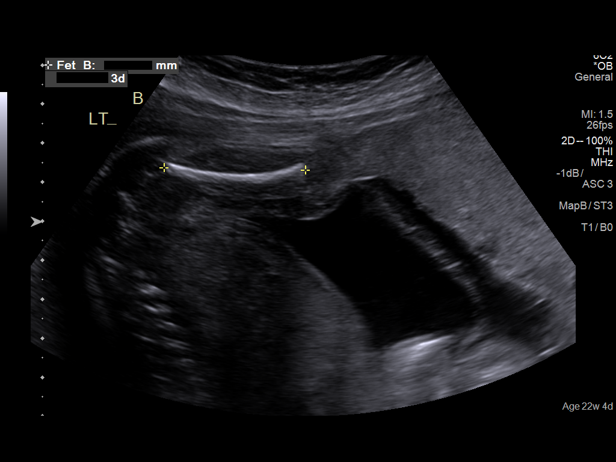
[im 115/156]
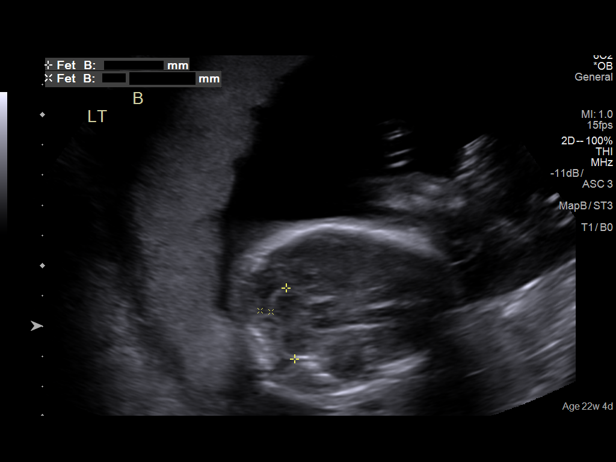
[im 127/156]
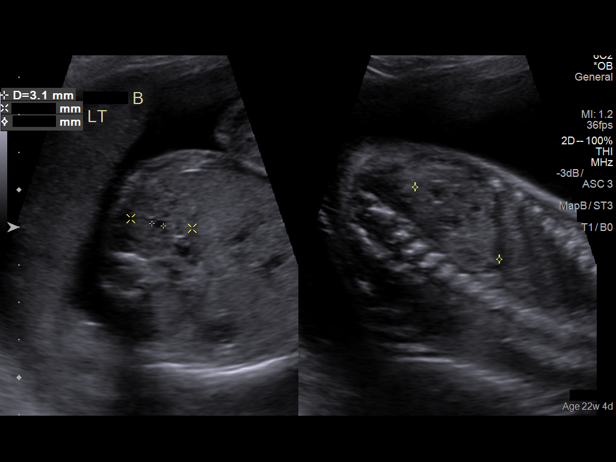
[im 138/156]
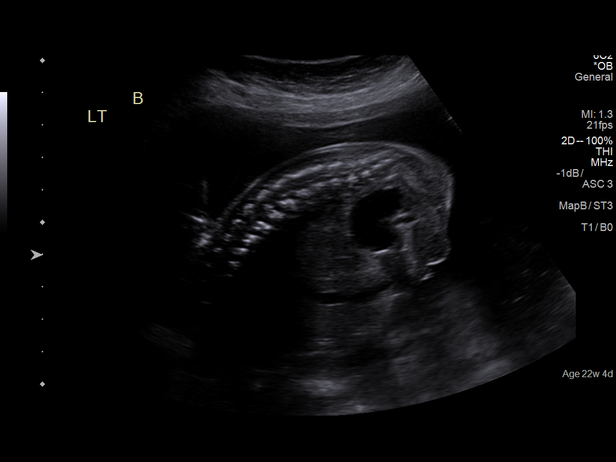
[im 150/156]
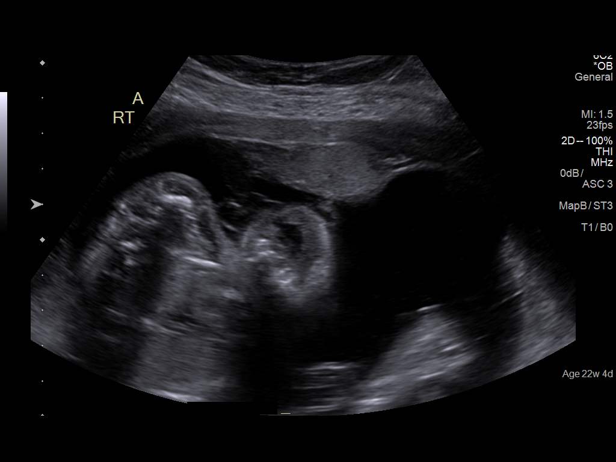

[13 of 28 positions shown; findings below may reference images not displayed]

IMPRESSION: Twin intrauterine pregnancy with a best estimated gestational
age of 22 weeks 4 days.  Dating is based on earliest
available ultrasound performed at [facility] on 11/13/2016;
measurement of the larger twin consistent with 6 weeks 4
days.

Placentation is dichorionic diamniotic based on separate
placental sites and a thick dividing membrane.

Twin A:  cephalic, right uterus, anterior placenta, appears
male.
There is a myleomeningocele visualized in the lumbosacral
region (vetebral level is unable to be better characterized
given the inability to perform 3D reconstructed volumes).
There a a "Oli" shaped head with scalloping of the frontal
bones and there is a "banana sign" with effacement of the
posterior fossa.  There is bilateral ventriculomegaly with
dangling choroids.  The posterior atrial diameter of the far
(right) field lateral ventricle measures 11.9 mm; the right
appears symmetric but is not in an optimal plane to measure.
Findings are consistent with an open neural tube defect.
In addition, the feet appear clubbed bilaterally.  There is a
single umbilical artery and there is mild pelviectasis of the
right renal pelvis (measuring up to 4.1 mm).
Other fetal anatomy visualized appears normal.  Appropriate
interval growth.

Twin B:  cephlic, left uterus, fundal placenta, appears male.

Due to fetal position, the sagittal views of the cervical spine
were suboptimally visualized; transverse views appear
normal.  Bicaval and 3 vessel trachea views of the heart were
suboptimal.  Other fetal anatomy visualized appears normal.
Appropriate interval growth.

Findings were discussed and Ms. Omayra had an opportunity
to meet with a genetic counselor. Open neural tube defects,
otherwise known as spina bifida, can occur in the setting of
certain teratogens (such as older anti-epileptic medication),
poorly controlled diabetes, or folate deficiency but can also
be sporadic and are usually multifactorial.  It is recommended
that women supplement with 4 grams of folic acid prior to any
future conceptions to help decrease the risk for recurrence.
Ms. Omayra has 2 half siblings with CNS abnormalities but
detailed information on their exact diagnosis are not available.

Prognosis and morbidity associated with open neural tube
defects largely depends on the "level" of the defect with
higher levels being more likely to impact
bowel/bladder/sexual function, ability to ambulate and to be
associated with ventriculomegaly, requiring shunting.  The
latter may be associated with development of infections, need
for repeat procedures as well as development of seizures and
Tiger differences.
We briefly discussed in utero repair as an option in singleton
pregnancies but presence of a twin is an exclusion criteria.
We discussed the option of termination of the pregnancy (out
of state given advanced gestational age) and selective
reduction (also out of state given advanced gestational age),
both of which the patient declined.  We then discussed the
recommendation for transfer of care to a tertiary care facility
for continuation of the pregnancy to either [facility] or [HOSPITAL[facility] (or [facility] given the patient's
residence) for the purposes of delivery and Pediatric
Neurosurgery and NICU availability.  We also discussed the
recommendation for c-section for delivery.  Note that Ms.
Omayra has a history of a prior c-section for delivery at term.
We also discussed the finding of a single umbilical artery
which is noted in about 1% of singleton pregnancies.  About
20% of fetuses with single umbilical artery (SUA) have a least
one associated structural fetal anomaly.  Presence of a single
umbilical artery somewhat increases maternal age related
baseline risk for a fetal chromosome abnormalities.  This
increase in risk is more significant if an associated anomaly is
present and less significant if SUA is an isolated finding.  We
discussed that single umbilical artery may be associated with
increased risk for fetal growth restriction and reviewed
recommendation for growth assessment in the third trimester.
Pelviectasis, also, is a relatively common prenatal finding,
and can improve, worsen or remain stable over time.
Pelviectasis can be a normal variant, but can also be
associated with condidtions such as ureteropelvic junction
obstruction, ureterovesical junction obstruction or reflux.

Ms. Omayra is committed to continuing the pregnancy but is
currently undecided about where to continue her care.  We
have offered to make appointments for her at either institution
and recommend a follow up detailed ultrasound with 3D
capability for further evaluating the Twin A's lesion.  Would
also recommend fetal echocardiogram for Twin A and consult
with NICU and Pediatric Neurosurgery as well as monthly
ultrasounds for growth.
She was offered and declined aneuploidy screening or testing
at this time.

We would be more than happy to help with any referrals or to
answer any questions.
